# Patient Record
Sex: Female | Born: 1940 | Race: Black or African American | Hispanic: No | Marital: Single | Smoking: Never smoker
Health system: Southern US, Community
[De-identification: ages and names within clinical notes are randomized; demographics above are authoritative.]

## PROBLEM LIST (undated history)

## (undated) DIAGNOSIS — K219 Gastro-esophageal reflux disease without esophagitis: Secondary | ICD-10-CM

## (undated) DIAGNOSIS — K449 Diaphragmatic hernia without obstruction or gangrene: Secondary | ICD-10-CM

## (undated) DIAGNOSIS — M109 Gout, unspecified: Secondary | ICD-10-CM

## (undated) DIAGNOSIS — I10 Essential (primary) hypertension: Secondary | ICD-10-CM

## (undated) DIAGNOSIS — I219 Acute myocardial infarction, unspecified: Secondary | ICD-10-CM

## (undated) DIAGNOSIS — I2510 Atherosclerotic heart disease of native coronary artery without angina pectoris: Secondary | ICD-10-CM

## (undated) HISTORY — PX: HIP SURGERY: SHX245

## (undated) HISTORY — PX: ABDOMINAL HYSTERECTOMY: SHX81

## (undated) HISTORY — PX: CORONARY ANGIOPLASTY WITH STENT PLACEMENT: SHX49

---

## 2016-11-11 ENCOUNTER — Encounter (HOSPITAL_BASED_OUTPATIENT_CLINIC_OR_DEPARTMENT_OTHER): Payer: Self-pay

## 2016-11-11 ENCOUNTER — Observation Stay (HOSPITAL_BASED_OUTPATIENT_CLINIC_OR_DEPARTMENT_OTHER)
Admission: EM | Admit: 2016-11-11 | Discharge: 2016-11-13 | Disposition: A | Discharge status: Home / Self Care | Payer: Medicare Other | Attending: Family Medicine | Admitting: Family Medicine

## 2016-11-11 ENCOUNTER — Emergency Department (HOSPITAL_BASED_OUTPATIENT_CLINIC_OR_DEPARTMENT_OTHER): Payer: Medicare Other

## 2016-11-11 DIAGNOSIS — I10 Essential (primary) hypertension: Secondary | ICD-10-CM | POA: Diagnosis not present

## 2016-11-11 DIAGNOSIS — M109 Gout, unspecified: Secondary | ICD-10-CM | POA: Diagnosis not present

## 2016-11-11 DIAGNOSIS — K8012 Calculus of gallbladder with acute and chronic cholecystitis without obstruction: Principal | ICD-10-CM | POA: Insufficient documentation

## 2016-11-11 DIAGNOSIS — Z888 Allergy status to other drugs, medicaments and biological substances status: Secondary | ICD-10-CM | POA: Insufficient documentation

## 2016-11-11 DIAGNOSIS — I252 Old myocardial infarction: Secondary | ICD-10-CM | POA: Diagnosis not present

## 2016-11-11 DIAGNOSIS — K819 Cholecystitis, unspecified: Secondary | ICD-10-CM

## 2016-11-11 DIAGNOSIS — Z955 Presence of coronary angioplasty implant and graft: Secondary | ICD-10-CM | POA: Insufficient documentation

## 2016-11-11 DIAGNOSIS — Z79899 Other long term (current) drug therapy: Secondary | ICD-10-CM | POA: Diagnosis not present

## 2016-11-11 DIAGNOSIS — E785 Hyperlipidemia, unspecified: Secondary | ICD-10-CM | POA: Insufficient documentation

## 2016-11-11 DIAGNOSIS — I2510 Atherosclerotic heart disease of native coronary artery without angina pectoris: Secondary | ICD-10-CM | POA: Diagnosis not present

## 2016-11-11 DIAGNOSIS — K219 Gastro-esophageal reflux disease without esophagitis: Secondary | ICD-10-CM | POA: Diagnosis not present

## 2016-11-11 DIAGNOSIS — Z7982 Long term (current) use of aspirin: Secondary | ICD-10-CM | POA: Insufficient documentation

## 2016-11-11 DIAGNOSIS — R109 Unspecified abdominal pain: Secondary | ICD-10-CM | POA: Diagnosis present

## 2016-11-11 DIAGNOSIS — K810 Acute cholecystitis: Secondary | ICD-10-CM

## 2016-11-11 HISTORY — DX: Diaphragmatic hernia without obstruction or gangrene: K44.9

## 2016-11-11 HISTORY — DX: Atherosclerotic heart disease of native coronary artery without angina pectoris: I25.10

## 2016-11-11 HISTORY — DX: Gout, unspecified: M10.9

## 2016-11-11 HISTORY — DX: Acute myocardial infarction, unspecified: I21.9

## 2016-11-11 HISTORY — DX: Essential (primary) hypertension: I10

## 2016-11-11 HISTORY — DX: Gastro-esophageal reflux disease without esophagitis: K21.9

## 2016-11-11 LAB — COMPREHENSIVE METABOLIC PANEL
ALK PHOS: 155 U/L — AB (ref 38–126)
ALT: 57 U/L — AB (ref 14–54)
AST: 33 U/L (ref 15–41)
Albumin: 3.9 g/dL (ref 3.5–5.0)
Anion gap: 11 (ref 5–15)
BILIRUBIN TOTAL: 0.5 mg/dL (ref 0.3–1.2)
BUN: 17 mg/dL (ref 6–20)
CO2: 26 mmol/L (ref 22–32)
CREATININE: 1.05 mg/dL — AB (ref 0.44–1.00)
Calcium: 9.5 mg/dL (ref 8.9–10.3)
Chloride: 102 mmol/L (ref 101–111)
GFR calc Af Amer: 58 mL/min — ABNORMAL LOW (ref >60)
GFR calc non Af Amer: 50 mL/min — ABNORMAL LOW (ref >60)
GLUCOSE: 103 mg/dL — AB (ref 65–99)
POTASSIUM: 3.6 mmol/L (ref 3.5–5.1)
Sodium: 139 mmol/L (ref 135–145)
TOTAL PROTEIN: 7.9 g/dL (ref 6.5–8.1)

## 2016-11-11 LAB — URINALYSIS, ROUTINE W REFLEX MICROSCOPIC
Bilirubin Urine: NEGATIVE
GLUCOSE, UA: NEGATIVE mg/dL
HGB URINE DIPSTICK: NEGATIVE
KETONES UR: NEGATIVE mg/dL
LEUKOCYTES UA: NEGATIVE
Nitrite: NEGATIVE
PH: 6.5 (ref 5.0–8.0)
Protein, ur: NEGATIVE mg/dL
Specific Gravity, Urine: 1.005 (ref 1.005–1.030)

## 2016-11-11 LAB — CBC WITH DIFFERENTIAL/PLATELET
BASOS ABS: 0 10*3/uL (ref 0.0–0.1)
Basophils Relative: 0 %
EOS PCT: 6 %
Eosinophils Absolute: 0.5 10*3/uL (ref 0.0–0.7)
HEMATOCRIT: 36.2 % (ref 36.0–46.0)
Hemoglobin: 13.1 g/dL (ref 12.0–15.0)
LYMPHS ABS: 3.6 10*3/uL (ref 0.7–4.0)
LYMPHS PCT: 43 %
MCH: 25.4 pg — AB (ref 26.0–34.0)
MCHC: 36.2 g/dL — AB (ref 30.0–36.0)
MCV: 70.2 fL — AB (ref 78.0–100.0)
MONO ABS: 1.1 10*3/uL — AB (ref 0.1–1.0)
MONOS PCT: 13 %
Neutro Abs: 3.2 10*3/uL (ref 1.7–7.7)
Neutrophils Relative %: 38 %
PLATELETS: 239 10*3/uL (ref 150–400)
RBC: 5.16 MIL/uL — ABNORMAL HIGH (ref 3.87–5.11)
RDW: 16.9 % — AB (ref 11.5–15.5)
WBC: 8.4 10*3/uL (ref 4.0–10.5)

## 2016-11-11 LAB — LIPASE, BLOOD: Lipase: 52 U/L — ABNORMAL HIGH (ref 11–51)

## 2016-11-11 MED ORDER — IOPAMIDOL (ISOVUE-300) INJECTION 61%
100.0000 mL | Freq: Once | INTRAVENOUS | Status: AC | PRN
  Administered 2016-11-11: 100 mL via INTRAVENOUS

## 2016-11-11 MED ORDER — FENTANYL CITRATE (PF) 100 MCG/2ML IJ SOLN
50.0000 ug | Freq: Once | INTRAMUSCULAR | Status: AC
  Administered 2016-11-11: 50 ug via INTRAVENOUS

## 2016-11-11 MED ORDER — SODIUM CHLORIDE 0.9 % IV BOLUS (SEPSIS)
1000.0000 mL | Freq: Once | INTRAVENOUS | Status: AC
  Administered 2016-11-11: 1000 mL via INTRAVENOUS

## 2016-11-11 MED ORDER — ONDANSETRON HCL 4 MG/2ML IJ SOLN
INTRAMUSCULAR | Status: AC
  Administered 2016-11-11: 4 mg
  Filled 2016-11-11: qty 2

## 2016-11-11 MED ORDER — FENTANYL CITRATE (PF) 100 MCG/2ML IJ SOLN
INTRAMUSCULAR | Status: AC
  Filled 2016-11-11: qty 2

## 2016-11-11 NOTE — ED Provider Notes (Signed)
[facility] Provider Note   CSN: [id number] Arrival date & time: 11/11/16  [id number]  By signing my name below, I, Rosana Fretana Waskiewicz, attest that this documentation has been prepared under the direction and in the presence of Rayshon Albaugh, Cindee Saltourteney Lyn, MD. Electronically Signed: Rosana Fretana Waskiewicz, ED Scribe. 11/11/16. 7:55 PM.  History   Chief Complaint Chief Complaint  Patient presents with  . Abdominal Pain   No language interpreter was used.   HPI Comments: Robin Ali is a 76 y.o. female with a PMHx of GERD and a hiatal hernia, who presents to the Emergency Department complaining of reoccurring, moderate abdominal pain onset 3 days ago. Pt states the pain radiates to her back. Pt reports associated constipation. Pt has tried miralax and stool softeners and once once she had a BM, she felt better. She states today the pain returned. Per pt, she had similar symptoms 3 years ago and it was constipation. Pt denies nausea, vomiting, fever, dysuria or any other complaints at this time.  Past Medical History:  Diagnosis Date  . GERD (gastroesophageal reflux disease)   . Gout   . Heart attack (HCC)   . Hiatal hernia   . Hypertension     There are no active problems to display for this patient.   Past Surgical History:  Procedure Laterality Date  . ABDOMINAL HYSTERECTOMY    . CORONARY ANGIOPLASTY WITH STENT PLACEMENT    . HIP SURGERY      OB History    No data available       Home Medications    Prior to Admission medications   Not on File    Family History No family history on file.  Social History Social History  Substance Use Topics  . Smoking status: Never Smoker  . Smokeless tobacco: Never Used  . Alcohol use Yes     Comment: occ     Allergies   Ace inhibitors and Norvasc [amlodipine besylate]   Review of Systems Review of Systems  Constitutional: Negative for fever.  Gastrointestinal: Positive for abdominal pain and constipation. Negative for  nausea and vomiting.  Genitourinary: Negative for dysuria.  Musculoskeletal: Positive for back pain.  All other systems reviewed and are negative.    Physical Exam Updated Vital Signs BP (!) 165/76 (BP Location: Left Arm) Comment: Large cuff Per PT  Pulse 65   Temp 99.1 F (37.3 C) (Oral)   Resp 18   Ht 5\' 1"  (1.549 m)   Wt 76.2 kg (168 lb)   SpO2 100%   BMI 31.74 kg/m   Physical Exam  Constitutional: She is oriented to person, place, and time. She appears well-developed and well-nourished.  HENT:  Head: Normocephalic.  Eyes: EOM are normal.  Neck: Normal range of motion.  Cardiovascular: Normal rate, regular rhythm, normal heart sounds and intact distal pulses.  Exam reveals no gallop and no friction rub.   No murmur heard. Pulmonary/Chest: Effort normal and breath sounds normal. No respiratory distress. She has no wheezes. She has no rales.  Abdominal: Soft. She exhibits no distension. There is no tenderness.  mild tenderness to deep palpation.   Musculoskeletal: Normal range of motion.  Neurological: She is alert and oriented to person, place, and time.  Psychiatric: She has a normal mood and affect.  Nursing note and vitals reviewed.    ED Treatments / Results  DIAGNOSTIC STUDIES: Oxygen Saturation is 100% on RA, normal by my interpretation.   COORDINATION OF CARE: 7:53 PM-Discussed next steps with  pt including a CT. Pt verbalized understanding and is agreeable with the plan.   Labs (all labs ordered are listed, but only abnormal results are displayed) Labs Reviewed  CBC WITH DIFFERENTIAL/PLATELET - Abnormal; Notable for the following:       Result Value   RBC 5.16 (*)    MCV 70.2 (*)    MCH 25.4 (*)    MCHC 36.2 (*)    RDW 16.9 (*)    Monocytes Absolute 1.1 (*)    All other components within normal limits  COMPREHENSIVE METABOLIC PANEL - Abnormal; Notable for the following:    Glucose, Bld 103 (*)    Creatinine, Ser 1.05 (*)    ALT 57 (*)     Alkaline Phosphatase 155 (*)    GFR calc non Af Amer 50 (*)    GFR calc Af Amer 58 (*)    All other components within normal limits  LIPASE, BLOOD - Abnormal; Notable for the following:    Lipase 52 (*)    All other components within normal limits  URINALYSIS, ROUTINE W REFLEX MICROSCOPIC    EKG  EKG Interpretation None       Radiology Ct Abdomen Pelvis W Contrast  Result Date: 11/11/2016 CLINICAL DATA:  76 year old female with abdominal pain radiating to the back. EXAM: CT ABDOMEN AND PELVIS WITH CONTRAST TECHNIQUE: Multidetector CT imaging of the abdomen and pelvis was performed using the standard protocol following bolus administration of intravenous contrast. CONTRAST:  100mL ISOVUE-300 IOPAMIDOL (ISOVUE-300) INJECTION 61% COMPARISON:  Abdominal CT dated 09/23/2014 FINDINGS: Lower chest: The visualized lung bases are clear. Multi vessel coronary vascular calcification. No intra-abdominal free air or free fluid. Hepatobiliary: The liver is unremarkable. No intrahepatic biliary ductal dilatation. The gallbladder is distended. There is thickened and edematous appearance of the wall of the gallbladder with enhancement of the gallbladder mucosa. No calcified stone identified. Ultrasound is recommended for better evaluation of the gallbladder. Pancreas: Unremarkable. No pancreatic ductal dilatation or surrounding inflammatory changes. Spleen: Normal in size without focal abnormality. Adrenals/Urinary Tract: A 9 mm indeterminate left adrenal nodule as seen on the prior CT, possibly an adenoma. The right adrenal gland is unremarkable. The kidneys, visualized ureters, and urinary bladder are grossly unremarkable. Stomach/Bowel: There is a small hiatal hernia with gastroesophageal reflux. The stomach is distended. There is no evidence of gastric outlet obstruction. Oral contrast opacifies multiple loops of small bowel. No evidence of bowel obstruction or active inflammation. There is moderate stool  throughout the colon. Normal appendix. Vascular/Lymphatic: There is moderate aortoiliac atherosclerotic disease. The IVC appears unremarkable. No portal venous gas identified. There is no adenopathy. Stable top-normal lymph node in the gastrohepatic ligament. Reproductive: Hysterectomy. Evaluation of the pelvis is limited due to streak artifact caused by bilateral hip arthroplasties. Other: Fat containing umbilical hernia. No inflammatory changes. No fluid collection. Musculoskeletal: Degenerative changes of the lower lumbar spine with facet hypertrophy. L5-S1 disc desiccation and vacuum phenomena. Bilateral hip arthroplasty changes. No acute fracture. IMPRESSION: 1. Distended and thickened gallbladder concerning for acute cholecystitis. Further evaluation with ultrasound recommended. 2. No bowel obstruction or active inflammation.  Normal appendix. 3.  Aortic Atherosclerosis (ICD10-I70.0). 4. Coronary vascular calcification. Electronically Signed   By: Elgie CollardArash  Radparvar M.D.   On: 11/11/2016 22:19    Procedures Procedures (including critical care time)  Medications Ordered in ED Medications  fentaNYL (SUBLIMAZE) 100 MCG/2ML injection (  Not Given 11/11/16 2055)  ondansetron (ZOFRAN) 4 MG/2ML injection (4 mg  Given 11/11/16 1927)  fentaNYL (  SUBLIMAZE) injection 50 mcg (50 mcg Intravenous Given 11/11/16 1945)  sodium chloride 0.9 % bolus 1,000 mL (0 mLs Intravenous Stopped 11/11/16 2307)  iopamidol (ISOVUE-300) 61 % injection 100 mL (100 mLs Intravenous Contrast Given 11/11/16 2156)     Initial Impression / Assessment and Plan / ED Course  I have reviewed the triage vital signs and the nursing notes.  Pertinent labs & imaging results that were available during my care of the patient were reviewed by me and considered in my medical decision making (see chart for details).     I personally performed the services described in this documentation, which was scribed in my presence. The recorded information has  been reviewed and is accurate.   Patient 76 year old female presenting with abdominal pain. Fairly nonspecific. Initially was epigastric but then patient Patient had treated use some MiraLAX and she felt much improved after stooling. This is earlier this week. Then the pain came back today. Patient's had mild nausea.  CT shows question will cholecystitis. Vague story, but mild abnormalities in lipase and ALT. Temp 99.1. Discussed with Dr. Dwain Sarna who believes it does not convincing without an [place]. Unfortunately [facility] has no [place] right now.   WIll transfer to [facility] for [place] in the ED.    Final Clinical Impressions(s) / ED Diagnoses   Final diagnoses:  Cholecystitis    New Prescriptions New Prescriptions   No medications on file     Abelino Derrick, MD 11/11/16 2312

## 2016-11-11 NOTE — ED Triage Notes (Signed)
C/o abd pain,mid back pain 5 days ago-better after miralax and BM-started back again today-last BM today-presents to triage in w/c-grimacing

## 2016-11-11 NOTE — ED Notes (Signed)
Pt and pt's visitor expresses irritation that EMT is not using the "big" cuff to do the pt's blood pressure. EMT explained to pt that the correct size needs to be used to get an accurate reading, if you use the wrong size you get a wrong reading. Pt and pt's visitor not happy with this answer. EMT told pt she would send in the RN to explain to the pt the same reasoning behind the blood pressure cuff.

## 2016-11-11 NOTE — ED Notes (Signed)
RN Janna Archata informed that Pts want their medications entered in her chart.

## 2016-11-12 ENCOUNTER — Inpatient Hospital Stay (HOSPITAL_COMMUNITY): Payer: Medicare Other | Admitting: Anesthesiology

## 2016-11-12 ENCOUNTER — Encounter (HOSPITAL_COMMUNITY): Payer: Self-pay | Admitting: Internal Medicine

## 2016-11-12 ENCOUNTER — Emergency Department (HOSPITAL_COMMUNITY): Payer: Medicare Other

## 2016-11-12 ENCOUNTER — Encounter (HOSPITAL_COMMUNITY)
Admission: EM | Disposition: A | Discharge status: Home / Self Care | Payer: Self-pay | Source: Home / Self Care | Attending: Physician Assistant

## 2016-11-12 DIAGNOSIS — K219 Gastro-esophageal reflux disease without esophagitis: Secondary | ICD-10-CM | POA: Diagnosis present

## 2016-11-12 DIAGNOSIS — K810 Acute cholecystitis: Secondary | ICD-10-CM | POA: Diagnosis present

## 2016-11-12 DIAGNOSIS — E785 Hyperlipidemia, unspecified: Secondary | ICD-10-CM | POA: Diagnosis present

## 2016-11-12 DIAGNOSIS — I2510 Atherosclerotic heart disease of native coronary artery without angina pectoris: Secondary | ICD-10-CM | POA: Diagnosis present

## 2016-11-12 DIAGNOSIS — M109 Gout, unspecified: Secondary | ICD-10-CM | POA: Diagnosis present

## 2016-11-12 DIAGNOSIS — K8012 Calculus of gallbladder with acute and chronic cholecystitis without obstruction: Secondary | ICD-10-CM | POA: Diagnosis not present

## 2016-11-12 DIAGNOSIS — I10 Essential (primary) hypertension: Secondary | ICD-10-CM | POA: Diagnosis present

## 2016-11-12 HISTORY — PX: CHOLECYSTECTOMY: SHX55

## 2016-11-12 LAB — GLUCOSE, CAPILLARY: Glucose-Capillary: 130 mg/dL — ABNORMAL HIGH (ref 65–99)

## 2016-11-12 LAB — BASIC METABOLIC PANEL
Anion gap: 9 (ref 5–15)
BUN: 12 mg/dL (ref 6–20)
CALCIUM: 9 mg/dL (ref 8.9–10.3)
CO2: 27 mmol/L (ref 22–32)
CREATININE: 1 mg/dL (ref 0.44–1.00)
Chloride: 104 mmol/L (ref 101–111)
GFR calc non Af Amer: 53 mL/min — ABNORMAL LOW (ref >60)
GLUCOSE: 106 mg/dL — AB (ref 65–99)
Potassium: 3.1 mmol/L — ABNORMAL LOW (ref 3.5–5.1)
Sodium: 140 mmol/L (ref 135–145)

## 2016-11-12 LAB — PROTIME-INR
INR: 1.23
PROTHROMBIN TIME: 15.6 s — AB (ref 11.4–15.2)

## 2016-11-12 LAB — CBC
HCT: 36.4 % (ref 36.0–46.0)
Hemoglobin: 12.6 g/dL (ref 12.0–15.0)
MCH: 24.6 pg — AB (ref 26.0–34.0)
MCHC: 34.6 g/dL (ref 30.0–36.0)
MCV: 71 fL — ABNORMAL LOW (ref 78.0–100.0)
PLATELETS: 201 10*3/uL (ref 150–400)
RBC: 5.13 MIL/uL — AB (ref 3.87–5.11)
RDW: 16.3 % — ABNORMAL HIGH (ref 11.5–15.5)
WBC: 9.5 10*3/uL (ref 4.0–10.5)

## 2016-11-12 LAB — MAGNESIUM: MAGNESIUM: 1.7 mg/dL (ref 1.7–2.4)

## 2016-11-12 LAB — TYPE AND SCREEN
ABO/RH(D): O POS
ANTIBODY SCREEN: NEGATIVE

## 2016-11-12 LAB — APTT: aPTT: 36 seconds (ref 24–36)

## 2016-11-12 LAB — SURGICAL PCR SCREEN
MRSA, PCR: NEGATIVE
Staphylococcus aureus: NEGATIVE

## 2016-11-12 LAB — ABO/RH: ABO/RH(D): O POS

## 2016-11-12 SURGERY — LAPAROSCOPIC CHOLECYSTECTOMY
Anesthesia: General | Site: Abdomen

## 2016-11-12 MED ORDER — ONDANSETRON HCL 4 MG/2ML IJ SOLN
INTRAMUSCULAR | Status: DC | PRN
  Administered 2016-11-12: 4 mg via INTRAVENOUS

## 2016-11-12 MED ORDER — SCOPOLAMINE 1 MG/3DAYS TD PT72
MEDICATED_PATCH | TRANSDERMAL | Status: DC | PRN
  Administered 2016-11-12: 1 via TRANSDERMAL

## 2016-11-12 MED ORDER — PHENYLEPHRINE 40 MCG/ML (10ML) SYRINGE FOR IV PUSH (FOR BLOOD PRESSURE SUPPORT)
PREFILLED_SYRINGE | INTRAVENOUS | Status: AC
  Filled 2016-11-12: qty 10

## 2016-11-12 MED ORDER — POTASSIUM CHLORIDE CRYS ER 20 MEQ PO TBCR
40.0000 meq | EXTENDED_RELEASE_TABLET | Freq: Once | ORAL | Status: AC
  Administered 2016-11-12: 40 meq via ORAL
  Filled 2016-11-12: qty 2

## 2016-11-12 MED ORDER — PHENYLEPHRINE HCL 10 MG/ML IJ SOLN
INTRAMUSCULAR | Status: DC | PRN
  Administered 2016-11-12: 10 ug/min via INTRAVENOUS

## 2016-11-12 MED ORDER — FENTANYL CITRATE (PF) 250 MCG/5ML IJ SOLN
INTRAMUSCULAR | Status: AC
  Filled 2016-11-12: qty 5

## 2016-11-12 MED ORDER — SUGAMMADEX SODIUM 200 MG/2ML IV SOLN
INTRAVENOUS | Status: DC | PRN
  Administered 2016-11-12: 304 mg via INTRAVENOUS

## 2016-11-12 MED ORDER — FENTANYL CITRATE (PF) 100 MCG/2ML IJ SOLN
25.0000 ug | INTRAMUSCULAR | Status: DC | PRN
  Administered 2016-11-12 (×4): 25 ug via INTRAVENOUS

## 2016-11-12 MED ORDER — ASPIRIN EC 81 MG PO TBEC
81.0000 mg | DELAYED_RELEASE_TABLET | Freq: Every day | ORAL | Status: DC
  Administered 2016-11-12 – 2016-11-13 (×2): 81 mg via ORAL
  Filled 2016-11-12 (×2): qty 1

## 2016-11-12 MED ORDER — ONDANSETRON HCL 4 MG/2ML IJ SOLN
INTRAMUSCULAR | Status: AC
  Filled 2016-11-12: qty 4

## 2016-11-12 MED ORDER — SODIUM CHLORIDE 0.9 % IV SOLN
INTRAVENOUS | Status: DC
  Administered 2016-11-12: 03:00:00 via INTRAVENOUS

## 2016-11-12 MED ORDER — 0.9 % SODIUM CHLORIDE (POUR BTL) OPTIME
TOPICAL | Status: DC | PRN
  Administered 2016-11-12: 1000 mL

## 2016-11-12 MED ORDER — LACTATED RINGERS IV SOLN
INTRAVENOUS | Status: DC
  Administered 2016-11-12: 09:00:00 via INTRAVENOUS

## 2016-11-12 MED ORDER — PROMETHAZINE HCL 25 MG PO TABS
25.0000 mg | ORAL_TABLET | Freq: Four times a day (QID) | ORAL | Status: DC | PRN
  Administered 2016-11-12: 25 mg via ORAL
  Filled 2016-11-12: qty 1

## 2016-11-12 MED ORDER — PIPERACILLIN-TAZOBACTAM 3.375 G IVPB
3.3750 g | Freq: Three times a day (TID) | INTRAVENOUS | Status: DC
  Administered 2016-11-12: 3.375 g via INTRAVENOUS
  Filled 2016-11-12 (×2): qty 50

## 2016-11-12 MED ORDER — PROMETHAZINE HCL 25 MG RE SUPP: 25.0000 mg | Freq: Four times a day (QID) | RECTAL | Status: DC | PRN

## 2016-11-12 MED ORDER — HYDROMORPHONE HCL 1 MG/ML IJ SOLN: 1.0000 mg | INTRAMUSCULAR | Status: DC | PRN

## 2016-11-12 MED ORDER — HYDRALAZINE HCL 20 MG/ML IJ SOLN: 5.0000 mg | INTRAMUSCULAR | Status: DC | PRN

## 2016-11-12 MED ORDER — ORAL CARE MOUTH RINSE: 15.0000 mL | Freq: Two times a day (BID) | OROMUCOSAL | Status: DC

## 2016-11-12 MED ORDER — ROCURONIUM BROMIDE 10 MG/ML (PF) SYRINGE
PREFILLED_SYRINGE | INTRAVENOUS | Status: DC | PRN
  Administered 2016-11-12: 50 mg via INTRAVENOUS

## 2016-11-12 MED ORDER — ONDANSETRON HCL 4 MG/2ML IJ SOLN: 4.0000 mg | Freq: Three times a day (TID) | INTRAMUSCULAR | Status: DC | PRN

## 2016-11-12 MED ORDER — LIDOCAINE 2% (20 MG/ML) 5 ML SYRINGE
INTRAMUSCULAR | Status: AC
  Filled 2016-11-12: qty 5

## 2016-11-12 MED ORDER — ONDANSETRON HCL 4 MG/2ML IJ SOLN
4.0000 mg | Freq: Four times a day (QID) | INTRAMUSCULAR | Status: DC | PRN
  Administered 2016-11-12: 4 mg via INTRAVENOUS
  Filled 2016-11-12: qty 2

## 2016-11-12 MED ORDER — DEXTROSE-NACL 5-0.9 % IV SOLN: INTRAVENOUS | Status: DC

## 2016-11-12 MED ORDER — POTASSIUM CHLORIDE IN NACL 20-0.9 MEQ/L-% IV SOLN
INTRAVENOUS | Status: DC
Start: 2016-11-12 — End: 2016-11-13
  Administered 2016-11-12 – 2016-11-13 (×2): via INTRAVENOUS
  Filled 2016-11-12 (×2): qty 1000

## 2016-11-12 MED ORDER — COLCHICINE 0.6 MG PO TABS
0.6000 mg | ORAL_TABLET | Freq: Every day | ORAL | Status: DC
  Administered 2016-11-12 – 2016-11-13 (×2): 0.6 mg via ORAL
  Filled 2016-11-12 (×2): qty 1

## 2016-11-12 MED ORDER — NIFEDIPINE ER 60 MG PO TB24
60.0000 mg | ORAL_TABLET | Freq: Every day | ORAL | Status: DC
  Administered 2016-11-13: 60 mg via ORAL
  Filled 2016-11-12 (×2): qty 1

## 2016-11-12 MED ORDER — ROCURONIUM BROMIDE 10 MG/ML (PF) SYRINGE
PREFILLED_SYRINGE | INTRAVENOUS | Status: AC
  Filled 2016-11-12: qty 5

## 2016-11-12 MED ORDER — BUPIVACAINE HCL (PF) 0.25 % IJ SOLN
INTRAMUSCULAR | Status: AC
  Filled 2016-11-12: qty 30

## 2016-11-12 MED ORDER — ONDANSETRON 4 MG PO TBDP: 4.0000 mg | ORAL_TABLET | Freq: Four times a day (QID) | ORAL | Status: DC | PRN

## 2016-11-12 MED ORDER — OXYCODONE HCL 5 MG PO TABS
5.0000 mg | ORAL_TABLET | ORAL | Status: DC | PRN
  Administered 2016-11-12 (×2): 5 mg via ORAL
  Administered 2016-11-13: 10 mg via ORAL
  Filled 2016-11-12: qty 2
  Filled 2016-11-12 (×2): qty 1

## 2016-11-12 MED ORDER — NITROGLYCERIN 0.4 MG SL SUBL: 0.4000 mg | SUBLINGUAL_TABLET | SUBLINGUAL | Status: DC | PRN

## 2016-11-12 MED ORDER — PIPERACILLIN-TAZOBACTAM 3.375 G IVPB 30 MIN
3.3750 g | Freq: Once | INTRAVENOUS | Status: AC
  Administered 2016-11-12: 3.375 g via INTRAVENOUS
  Filled 2016-11-12: qty 50

## 2016-11-12 MED ORDER — DEXAMETHASONE SODIUM PHOSPHATE 10 MG/ML IJ SOLN
INTRAMUSCULAR | Status: DC | PRN
  Administered 2016-11-12: 10 mg via INTRAVENOUS

## 2016-11-12 MED ORDER — CARVEDILOL 25 MG PO TABS
25.0000 mg | ORAL_TABLET | Freq: Two times a day (BID) | ORAL | Status: DC
  Administered 2016-11-12 – 2016-11-13 (×3): 25 mg via ORAL
  Filled 2016-11-12 (×3): qty 1

## 2016-11-12 MED ORDER — EPHEDRINE 5 MG/ML INJ
INTRAVENOUS | Status: AC
  Filled 2016-11-12: qty 10

## 2016-11-12 MED ORDER — PROPOFOL 10 MG/ML IV BOLUS
INTRAVENOUS | Status: DC | PRN
  Administered 2016-11-12: 120 mg via INTRAVENOUS

## 2016-11-12 MED ORDER — CHLORHEXIDINE GLUCONATE 0.12 % MT SOLN
15.0000 mL | Freq: Two times a day (BID) | OROMUCOSAL | Status: DC
  Filled 2016-11-12 (×2): qty 15

## 2016-11-12 MED ORDER — FENTANYL CITRATE (PF) 100 MCG/2ML IJ SOLN
INTRAMUSCULAR | Status: DC | PRN
  Administered 2016-11-12 (×4): 50 ug via INTRAVENOUS

## 2016-11-12 MED ORDER — ARTIFICIAL TEARS OPHTHALMIC OINT
TOPICAL_OINTMENT | OPHTHALMIC | Status: AC
  Filled 2016-11-12: qty 3.5

## 2016-11-12 MED ORDER — SODIUM CHLORIDE 0.9 % IR SOLN
Status: DC | PRN
  Administered 2016-11-12: 1

## 2016-11-12 MED ORDER — MORPHINE SULFATE (PF) 4 MG/ML IV SOLN
2.0000 mg | INTRAVENOUS | Status: DC | PRN
  Filled 2016-11-12: qty 1

## 2016-11-12 MED ORDER — FENTANYL CITRATE (PF) 100 MCG/2ML IJ SOLN
INTRAMUSCULAR | Status: AC
  Filled 2016-11-12: qty 2

## 2016-11-12 MED ORDER — SUGAMMADEX SODIUM 200 MG/2ML IV SOLN
INTRAVENOUS | Status: AC
  Filled 2016-11-12: qty 2

## 2016-11-12 MED ORDER — SUCCINYLCHOLINE CHLORIDE 200 MG/10ML IV SOSY
PREFILLED_SYRINGE | INTRAVENOUS | Status: AC
  Filled 2016-11-12: qty 10

## 2016-11-12 MED ORDER — FENTANYL CITRATE (PF) 100 MCG/2ML IJ SOLN
50.0000 ug | Freq: Once | INTRAMUSCULAR | Status: AC
  Administered 2016-11-12: 50 ug via INTRAVENOUS
  Filled 2016-11-12: qty 2

## 2016-11-12 MED ORDER — PANTOPRAZOLE SODIUM 40 MG PO TBEC
40.0000 mg | DELAYED_RELEASE_TABLET | Freq: Every day | ORAL | Status: DC
  Administered 2016-11-12 – 2016-11-13 (×2): 40 mg via ORAL
  Filled 2016-11-12 (×2): qty 1

## 2016-11-12 MED ORDER — DEXAMETHASONE SODIUM PHOSPHATE 10 MG/ML IJ SOLN
INTRAMUSCULAR | Status: AC
  Filled 2016-11-12: qty 1

## 2016-11-12 MED ORDER — POLYETHYLENE GLYCOL 3350 17 G PO PACK: 17.0000 g | PACK | Freq: Every day | ORAL | Status: DC | PRN

## 2016-11-12 MED ORDER — BUPIVACAINE HCL 0.25 % IJ SOLN
INTRAMUSCULAR | Status: DC | PRN
  Administered 2016-11-12: 6 mL

## 2016-11-12 MED ORDER — PROPOFOL 10 MG/ML IV BOLUS
INTRAVENOUS | Status: AC
  Filled 2016-11-12: qty 20

## 2016-11-12 MED ORDER — LIDOCAINE 2% (20 MG/ML) 5 ML SYRINGE
INTRAMUSCULAR | Status: DC | PRN
  Administered 2016-11-12: 60 mg via INTRAVENOUS

## 2016-11-12 SURGICAL SUPPLY — 42 items
BENZOIN TINCTURE PRP APPL 2/3 (GAUZE/BANDAGES/DRESSINGS) ×3 IMPLANT
CANISTER SUCT 3000ML PPV (MISCELLANEOUS) ×3 IMPLANT
CHLORAPREP W/TINT 26ML (MISCELLANEOUS) ×3 IMPLANT
CLIP VESOLOCK MED LG 6/CT (CLIP) ×3 IMPLANT
CLOSURE WOUND 1/2 X4 (GAUZE/BANDAGES/DRESSINGS) ×1
COVER SURGICAL LIGHT HANDLE (MISCELLANEOUS) ×3 IMPLANT
COVER TRANSDUCER ULTRASND (DRAPES) ×3 IMPLANT
ELECT REM PT RETURN 9FT ADLT (ELECTROSURGICAL) ×3
ELECTRODE REM PT RTRN 9FT ADLT (ELECTROSURGICAL) ×1 IMPLANT
GAUZE SPONGE 2X2 8PLY STRL LF (GAUZE/BANDAGES/DRESSINGS) ×1 IMPLANT
GLOVE BIO SURGEON STRL SZ7.5 (GLOVE) ×3 IMPLANT
GLOVE BIOGEL PI IND STRL 7.0 (GLOVE) ×1 IMPLANT
GLOVE BIOGEL PI IND STRL 8.5 (GLOVE) ×1 IMPLANT
GLOVE BIOGEL PI INDICATOR 7.0 (GLOVE) ×2
GLOVE BIOGEL PI INDICATOR 8.5 (GLOVE) ×2
GLOVE SURG SS PI 7.0 STRL IVOR (GLOVE) ×3 IMPLANT
GLOVE SURG SS PI 8.0 STRL IVOR (GLOVE) ×3 IMPLANT
GOWN STRL REUS W/ TWL LRG LVL3 (GOWN DISPOSABLE) ×2 IMPLANT
GOWN STRL REUS W/ TWL XL LVL3 (GOWN DISPOSABLE) ×1 IMPLANT
GOWN STRL REUS W/TWL LRG LVL3 (GOWN DISPOSABLE) ×4
GOWN STRL REUS W/TWL XL LVL3 (GOWN DISPOSABLE) ×2
GRASPER SUT TROCAR 14GX15 (MISCELLANEOUS) ×3 IMPLANT
KIT BASIN OR (CUSTOM PROCEDURE TRAY) ×3 IMPLANT
KIT ROOM TURNOVER OR (KITS) ×3 IMPLANT
NEEDLE INSUFFLATION 14GA 120MM (NEEDLE) ×3 IMPLANT
NS IRRIG 1000ML POUR BTL (IV SOLUTION) ×3 IMPLANT
PAD ARMBOARD 7.5X6 YLW CONV (MISCELLANEOUS) ×6 IMPLANT
POUCH RETRIEVAL ECOSAC 10 (ENDOMECHANICALS) IMPLANT
POUCH RETRIEVAL ECOSAC 10MM (ENDOMECHANICALS)
SCISSORS LAP 5X35 DISP (ENDOMECHANICALS) ×3 IMPLANT
SET IRRIG TUBING LAPAROSCOPIC (IRRIGATION / IRRIGATOR) ×3 IMPLANT
SLEEVE ENDOPATH XCEL 5M (ENDOMECHANICALS) ×3 IMPLANT
SPECIMEN JAR SMALL (MISCELLANEOUS) ×3 IMPLANT
SPONGE GAUZE 2X2 STER 10/PKG (GAUZE/BANDAGES/DRESSINGS) ×2
STRIP CLOSURE SKIN 1/2X4 (GAUZE/BANDAGES/DRESSINGS) ×2 IMPLANT
SUT MNCRL AB 3-0 PS2 18 (SUTURE) ×3 IMPLANT
TOWEL OR 17X24 6PK STRL BLUE (TOWEL DISPOSABLE) ×3 IMPLANT
TOWEL OR 17X26 10 PK STRL BLUE (TOWEL DISPOSABLE) ×3 IMPLANT
TRAY LAPAROSCOPIC MC (CUSTOM PROCEDURE TRAY) ×3 IMPLANT
TROCAR XCEL NON-BLD 11X100MML (ENDOMECHANICALS) ×3 IMPLANT
TROCAR XCEL NON-BLD 5MMX100MML (ENDOMECHANICALS) ×3 IMPLANT
TUBING INSUFFLATION (TUBING) ×3 IMPLANT

## 2016-11-12 NOTE — Progress Notes (Signed)
Pharmacy Antibiotic Note  Robin Ali is a 76 y.o. female admitted on 11/11/2016 with cholecystitis.  Pharmacy has been consulted for Zosyn dosing.  Plan: Zosyn 3.375g IV q8h (4 hour infusion).  Height: 5\' 1"  (154.9 cm) Weight: 168 lb (76.2 kg) IBW/kg (Calculated) : 47.8  Temp (24hrs), Avg:98.6 F (37 C), Min:98.1 F (36.7 C), Max:99.1 F (37.3 C)   Recent Labs Lab 11/11/16 1928  WBC 8.4  CREATININE 1.05*    Estimated Creatinine Clearance: 41.9 mL/min (A) (by C-G formula based on SCr of 1.05 mg/dL (H)).    Allergies  Allergen Reactions  . Ace Inhibitors   . Norvasc [Amlodipine Besylate]     Thank you for allowing pharmacy to be a part of this patient's care.  Vernard GamblesVeronda Fisher Hargadon, PharmD, BCPS  11/12/2016 2:50 AM

## 2016-11-12 NOTE — Progress Notes (Signed)
Patient returned to 6n05 from PACU, alert slightly drowsy, feeling okay just tender. IV fluids running, on 2L o2, 3 abdominal lap sites with gauze all clean/dry/intact. Patient's daughter in room, notified them if they needed anything to call, otherwise we'll let her rest.

## 2016-11-12 NOTE — Transfer of Care (Signed)
Immediate Anesthesia Transfer of Care Note  Patient: Robin Ali  Procedure(s) Performed: Procedure(s): LAPAROSCOPIC CHOLECYSTECTOMY (N/A)  Patient Location: PACU  Anesthesia Type:General  Level of Consciousness: awake, alert  and oriented  Airway & Oxygen Therapy: Patient Spontanous Breathing and Patient connected to nasal cannula oxygen  Post-op Assessment: Report given to RN and Post -op Vital signs reviewed and stable  Post vital signs: Reviewed and stable  Last Vitals:  Vitals:   11/12/16 0400 11/12/16 0431  BP: (!) 152/64 (!) 165/65  Pulse: (!) 57 63  Resp:  18  Temp:  36.7 C   HR 64, RR 17, sats 99%. bP 146/67 Last Pain:  Vitals:   11/12/16 0431  TempSrc: Oral  PainSc:          Complications: No apparent anesthesia complications

## 2016-11-12 NOTE — H&P (Signed)
History and Physical    Robin Ali ZOX:[id number] DOB: 1941-02-09 DOA: 11/11/2016  Referring MD/NP/PA:   PCP: Leola Brazil, DO   Patient coming from:  The patient is coming from home.  At baseline, pt is independent for most of ADL. SNF  Assistant living facility   Retirement center.       Chief Complaint: Abdominal pain  HPI: Robin Ali is a 76 y.o. female with medical history significant of hypertension, GERD, gout, CAD, s/p of stent placement, hiatal hernia, who presents with abdominal pain.  Patient states that she has been having abdominal pain in the past 5 days. It is located in the upper abdomen, constant, aching, tingling out of 10 in severity, radiating to the back. It is not associated with nausea, vomiting or diarrhea. No fever or chills. Last bowel movement was yesterday morning. Patient denies chest pain, SOB, cough, symptoms of UTI or unilateral weakness.  ED Course: pt was found to have lipase 52, WBC 8.4, negative urinalysis, abnormal liver functions with AST 33, ALT 57, ALP 152, total bilirubin 0.5, electrolytes renal function okay, temperature 99.1, tachycardia, O2 sat 100% on room air. CT abdomen/pelvis and abdominal ultrasound showed cholecystitis. Patient is admitted to the MedSurg bed as inpatient. Gen. surgeon, Dr. Dwain Sarna was consulted.   Review of Systems:   General: no fevers, chills, no changes in body weight, has poor appetite, has fatigue HEENT: no blurry vision, hearing changes or sore throat Respiratory: no dyspnea, coughing, wheezing CV: no chest pain, no palpitations GI: no nausea, vomiting, has abdominal pain, no diarrhea, constipation GU: no dysuria, burning on urination, increased urinary frequency, hematuria  Ext: no leg edema Neuro: no unilateral weakness, numbness, or tingling, no vision change or hearing loss Skin: no rash, no skin tear. MSK: No muscle spasm, no deformity, no limitation of range of movement in spin Heme: No easy  bruising.  Travel history: No recent long distant travel.  Allergy:  Allergies  Allergen Reactions  . Ace Inhibitors Cough  . Norvasc [Amlodipine Besylate] Swelling    Past Medical History:  Diagnosis Date  . CAD (coronary artery disease)   . GERD (gastroesophageal reflux disease)   . Gout   . Heart attack (HCC)   . Hiatal hernia   . Hypertension     Past Surgical History:  Procedure Laterality Date  . ABDOMINAL HYSTERECTOMY    . CORONARY ANGIOPLASTY WITH STENT PLACEMENT    . HIP SURGERY      Social History:  reports that she has never smoked. She has never used smokeless tobacco. She reports that she drinks alcohol. She reports that she does not use drugs.  Family History:  Family History  Problem Relation Age of Onset  . Congestive Heart Failure Mother   . Pancreatic cancer Brother      Prior to Admission medications   Medication Sig Start Date End Date Taking? Authorizing Provider  aspirin EC 81 MG tablet Take 81 mg by mouth daily.   Yes [provider]  carvedilol (COREG) 25 MG tablet Take 25 mg by mouth 2 (two) times daily with a meal.   Yes [provider]  colchicine 0.6 MG tablet Take 0.6 mg by mouth daily.   Yes [provider]  NIFEdipine (PROCARDIA XL/ADALAT-CC) 60 MG 24 hr tablet Take 60 mg by mouth daily.   Yes [provider]  nitrofurantoin (MACRODANTIN) 100 MG capsule Take 100 mg by mouth 2 (two) times daily.   Yes [provider]  pantoprazole (PROTONIX) 40 MG tablet Take 40 mg by mouth daily.   Yes [provider]  triamterene-hydrochlorothiazide (MAXZIDE-25) 37.5-25 MG tablet Take 1 tablet by mouth daily.   Yes [provider]    Physical Exam: Vitals:   11/12/16 0300 11/12/16 0330 11/12/16 0400 11/12/16 0431  BP: (!) 185/73 (!) 165/63 (!) 152/64 (!) 165/65  Pulse: 61 (!) 57 (!) 57 63  Resp:    18  Temp:    98.1 F (36.7 C)  TempSrc:    Oral  SpO2: 97% 98% 96% 100%  Weight:    76  kg (167 lb 8.8 oz)  Height:    5\' 1"  (1.549 m)   General: Not in acute distress HEENT:       Eyes: PERRL, EOMI, no scleral icterus.       ENT: No discharge from the ears and nose, no pharynx injection, no tonsillar enlargement.        Neck: No JVD, no bruit, no mass felt. Heme: No neck lymph node enlargement. Cardiac: S1/S2, RRR, No murmurs, No gallops or rubs. Respiratory: Good air movement bilaterally. No rales, wheezing, rhonchi or rubs. GI: Soft, nondistended, has tenderness in epigastric area. no rebound pain, no organomegaly, BS present. GU: No hematuria Ext: No pitting leg edema bilaterally. 2+DP/PT pulse bilaterally. Musculoskeletal: No joint deformities, No joint redness or warmth, no limitation of ROM in spin. Skin: No rashes.  Neuro: Alert, oriented X3, cranial nerves II-XII grossly intact, moves all extremities normally. Psych: Patient is not psychotic, no suicidal or hemocidal ideation.  Labs on Admission: I have personally reviewed following labs and imaging studies  CBC:  Recent Labs Lab 11/11/16 1928 11/12/16 0448  WBC 8.4 9.5  NEUTROABS 3.2  --   HGB 13.1 12.6  HCT 36.2 36.4  MCV 70.2* 71.0*  PLT 239 201   Basic Metabolic Panel:  Recent Labs Lab 11/11/16 1928 11/12/16 0448  NA 139 140  K 3.6 3.1*  CL 102 104  CO2 26 27  GLUCOSE 103* 106*  BUN 17 12  CREATININE 1.05* 1.00  CALCIUM 9.5 9.0   GFR: Estimated Creatinine Clearance: 44 mL/min (by C-G formula based on SCr of 1 mg/dL). Liver Function Tests:  Recent Labs Lab 11/11/16 1928  AST 33  ALT 57*  ALKPHOS 155*  BILITOT 0.5  PROT 7.9  ALBUMIN 3.9    Recent Labs Lab 11/11/16 1928  LIPASE 52*   No results for input(s): AMMONIA in the last 168 hours. Coagulation Profile:  Recent Labs Lab 11/12/16 0447  INR 1.23   Cardiac Enzymes: No results for input(s): CKTOTAL, CKMB, CKMBINDEX, TROPONINI in the last 168 hours. BNP (last 3 results) No results for input(s): PROBNP in the last  8760 hours. HbA1C: No results for input(s): HGBA1C in the last 72 hours. CBG: No results for input(s): GLUCAP in the last 168 hours. Lipid Profile: No results for input(s): CHOL, HDL, LDLCALC, TRIG, CHOLHDL, LDLDIRECT in the last 72 hours. Thyroid Function Tests: No results for input(s): TSH, T4TOTAL, FREET4, T3FREE, THYROIDAB in the last 72 hours. Anemia Panel: No results for input(s): VITAMINB12, FOLATE, FERRITIN, TIBC, IRON, RETICCTPCT in the last 72 hours. Urine analysis:    Component Value Date/Time   COLORURINE YELLOW 11/11/2016 1929   APPEARANCEUR CLEAR 11/11/2016 1929   LABSPEC 1.005 11/11/2016 1929   PHURINE 6.5 11/11/2016 1929   GLUCOSEU NEGATIVE 11/11/2016 1929   HGBUR NEGATIVE 11/11/2016 1929   BILIRUBINUR NEGATIVE 11/11/2016 1929   KETONESUR NEGATIVE 11/11/2016 1929  PROTEINUR NEGATIVE 11/11/2016 1929   NITRITE NEGATIVE 11/11/2016 1929   LEUKOCYTESUR NEGATIVE 11/11/2016 1929   Sepsis Labs: @LABRCNTIP (procalcitonin:4,lacticidven:4) )No results found for this or any previous visit (from the past 240 hour(s)).   Radiological Exams on Admission: Ct Abdomen Pelvis W Contrast  Result Date: 11/11/2016 CLINICAL DATA:  76 year old female with abdominal pain radiating to the back. EXAM: CT ABDOMEN AND PELVIS WITH CONTRAST TECHNIQUE: Multidetector CT imaging of the abdomen and pelvis was performed using the standard protocol following bolus administration of intravenous contrast. CONTRAST:  100mL ISOVUE-300 IOPAMIDOL (ISOVUE-300) INJECTION 61% COMPARISON:  Abdominal CT dated 09/23/2014 FINDINGS: Lower chest: The visualized lung bases are clear. Multi vessel coronary vascular calcification. No intra-abdominal free air or free fluid. Hepatobiliary: The liver is unremarkable. No intrahepatic biliary ductal dilatation. The gallbladder is distended. There is thickened and edematous appearance of the wall of the gallbladder with enhancement of the gallbladder mucosa. No calcified stone  identified. Ultrasound is recommended for better evaluation of the gallbladder. Pancreas: Unremarkable. No pancreatic ductal dilatation or surrounding inflammatory changes. Spleen: Normal in size without focal abnormality. Adrenals/Urinary Tract: A 9 mm indeterminate left adrenal nodule as seen on the prior CT, possibly an adenoma. The right adrenal gland is unremarkable. The kidneys, visualized ureters, and urinary bladder are grossly unremarkable. Stomach/Bowel: There is a small hiatal hernia with gastroesophageal reflux. The stomach is distended. There is no evidence of gastric outlet obstruction. Oral contrast opacifies multiple loops of small bowel. No evidence of bowel obstruction or active inflammation. There is moderate stool throughout the colon. Normal appendix. Vascular/Lymphatic: There is moderate aortoiliac atherosclerotic disease. The IVC appears unremarkable. No portal venous gas identified. There is no adenopathy. Stable top-normal lymph node in the gastrohepatic ligament. Reproductive: Hysterectomy. Evaluation of the pelvis is limited due to streak artifact caused by bilateral hip arthroplasties. Other: Fat containing umbilical hernia. No inflammatory changes. No fluid collection. Musculoskeletal: Degenerative changes of the lower lumbar spine with facet hypertrophy. L5-S1 disc desiccation and vacuum phenomena. Bilateral hip arthroplasty changes. No acute fracture. IMPRESSION: 1. Distended and thickened gallbladder concerning for acute cholecystitis. Further evaluation with ultrasound recommended. 2. No bowel obstruction or active inflammation.  Normal appendix. 3.  Aortic Atherosclerosis (ICD10-I70.0). 4. Coronary vascular calcification. Electronically Signed   By: Elgie CollardArash  Radparvar M.D.   On: 11/11/2016 22:19   [place] Abdomen Limited Ruq  Result Date: 11/12/2016 CLINICAL DATA:  Abdominal pain tonight.  Abnormal gallbladder on CT. EXAM: ULTRASOUND ABDOMEN LIMITED RIGHT UPPER QUADRANT COMPARISON:  CT  abdomen and pelvis 11/11/2016. Abdominal ultrasound 07/15/2016 FINDINGS: Gallbladder: Tiny stones and sludge layering in the gallbladder. Stone measures about 2 mm. Diffuse gallbladder wall thickening with pericholecystic edema. Gallbladder wall measures up to about 8 mm diameter. Murphy's sign is negative. Common bile duct: Diameter: 4.4 mm, normal Liver: No focal lesion identified. Within normal limits in parenchymal echogenicity. IMPRESSION: Cholelithiasis and sludge in the gallbladder with diffuse gallbladder wall thickening and pericholecystic edema. Changes are suspicious for acute cholecystitis. Electronically Signed   By: Burman NievesWilliam  Stevens M.D.   On: 11/12/2016 02:00     EKG:  Not done in ED, will get one.   Assessment/Plan Principal Problem:   Acute cholecystitis Active Problems:   Hypertension   Gout   CAD (coronary artery disease)   GERD (gastroesophageal reflux disease)   HLD (hyperlipidemia)   Acute cholecystitis: The patient's abdominal pain is most likely caused by cholecystitis as evidenced by both CT scan and abdominal ultrasound. Patient does not have leukocytosis or fever,  clinically nonseptic. Hemodynamically stable. Gen. surgeon, Dr. Dwain SarnaWakefield was consulted.  -will admit to med-surg bed as inpt -IV zosyn -get Blood culture -prn Zofran for nausea and morphine for pain - INR/PTT/type & screen -Follow-up Gen. surgeon's recommendation  HTN: -IV hydralazine when necessary -Continue Coreg and nifedipine -hold Maxizde since pt needs IVF on NPO  GERD: -Protonix  Hx of CAD (coronary artery disease): s/p of stent -continue ASA, coreg -hold lipitor due to abnormal LFT -prn NTG  Gout: -continue home Colchicine  HLD: -hold lipitor due to abnormal LFT   DVT ppx: SCD Code Status: Full code Family Communication:  Yes, patient's daughter at bed side Disposition Plan:  Anticipate discharge back to previous home environment Consults called:  Gen. Careers advisersurgeon, Dr.  Dwain SarnaWakefield Admission status:  medical floor/inpt  Date of Service 11/12/2016    Lorretta HarpIU, Thaer Miyoshi [facility] Pager [phone]  If 7PM-7AM, please contact night-coverage [email or web address] Password [email or web address] 11/12/2016, 7:15 AM

## 2016-11-12 NOTE — Progress Notes (Signed)
Patient transported to surgery, pre-op checklist complete, report called to short stay, patient called daughter and family is on the way. IV fluids/antibiotics running upon leaving unit.

## 2016-11-12 NOTE — Progress Notes (Signed)
Called for report. RN unavailable.

## 2016-11-12 NOTE — ED Notes (Signed)
Family at bedside. 

## 2016-11-12 NOTE — Anesthesia Preprocedure Evaluation (Addendum)
Anesthesia Evaluation  Patient identified by MRN, date of birth, ID band Patient awake    Reviewed: Allergy & Precautions, NPO status , Patient's Chart, lab work & pertinent test results, reviewed documented beta blocker date and time   History of Anesthesia Complications Negative for: history of anesthetic complications  Airway Mallampati: II  TM Distance: >3 FB Neck ROM: Full    Dental  (+) Dental Advisory Given   Pulmonary neg pulmonary ROS,    breath sounds clear to auscultation       Cardiovascular hypertension, Pt. on medications and Pt. on home beta blockers (-) angina+ CAD, + Past MI and + Cardiac Stents   Rhythm:Regular Rate:Normal     Neuro/Psych negative neurological ROS     GI/Hepatic GERD  Medicated and Controlled,Elevated LFTs with acute chole   Endo/Other  Morbid obesity  Renal/GU negative Renal ROS     Musculoskeletal   Abdominal (+) + obese,   Peds  Hematology negative hematology ROS (+)   Anesthesia Other Findings   Reproductive/Obstetrics                            Anesthesia Physical Anesthesia Plan  ASA: III  Anesthesia Plan: General   Post-op Pain Management:    Induction: Intravenous  PONV Risk Score and Plan: 4 or greater and Ondansetron, Dexamethasone, Midazolam and Scopolamine patch - Pre-op  Airway Management Planned:   Additional Equipment:   Intra-op Plan:   Post-operative Plan: Extubation in OR  Informed Consent: I have reviewed the patients History and Physical, chart, labs and discussed the procedure including the risks, benefits and alternatives for the proposed anesthesia with the patient or authorized representative who has indicated his/her understanding and acceptance.   Dental advisory given  Plan Discussed with: CRNA and Surgeon  Anesthesia Plan Comments: (Plan routine monitors, GETA )        Anesthesia Quick Evaluation

## 2016-11-12 NOTE — Consult Note (Signed)
Reason for Consult:abd pain, gallstones Referring Physician: Dr Leonides Schanz  Robin Ali is an 76 y.o. female.  HPI: 46 yof who has history of cad/prior mi, htn, c diff after abx usage after tha who presents with episode of ruq pain after eating on Saturday night that resolved.  Then last 48 hours developed worsening ruq pain radiating to her back that has persisted.  She attempted some miralax with episodes but did not help . Nothing was relieving the pain until she received narcotics here.  No fevers.   Some constipation.  She has no n/v.  She presented to med center and underwent ct scan that showed distended thickened gb.  She was transferred here and underwent US that shows cholelithiasis and sludge with diffuse gbw thickening and 8 mm gb wall.  cbd is normal. lfts only with mildly elevated alt.  She is here with her daughter.  Has history of coronary stent placement, only on asa.    Past Medical History:  Diagnosis Date  . CAD (coronary artery disease)   . GERD (gastroesophageal reflux disease)   . Gout   . Heart attack ([place])   . Hiatal hernia   . Hypertension     Past Surgical History:  Procedure Laterality Date  . ABDOMINAL HYSTERECTOMY    . CORONARY ANGIOPLASTY WITH STENT PLACEMENT    . HIP SURGERY      Family History  Problem Relation Age of Onset  . Congestive Heart Failure Mother   . Pancreatic cancer Brother     Social History:  reports that she has never smoked. She has never used smokeless tobacco. She reports that she drinks alcohol. She reports that she does not use drugs.  Allergies:  Allergies  Allergen Reactions  . Ace Inhibitors   . Norvasc [Amlodipine Besylate]     Medications: I have reviewed the patient's current medications.  Results for orders placed or performed during the hospital encounter of 11/11/16 (from the past 48 hour(s))  CBC with Differential     Status: Abnormal   Collection Time: 11/11/16  7:28 PM  Result Value Ref Range   WBC 8.4 4.0  - 10.5 K/uL   RBC 5.16 (H) 3.87 - 5.11 MIL/uL   Hemoglobin 13.1 12.0 - 15.0 g/dL   HCT 36.2 36.0 - 46.0 %   MCV 70.2 (L) 78.0 - 100.0 fL   MCH 25.4 (L) 26.0 - 34.0 pg   MCHC 36.2 (H) 30.0 - 36.0 g/dL   RDW 16.9 (H) 11.5 - 15.5 %   Platelets 239 150 - 400 K/uL   Neutrophils Relative % 38 %   Neutro Abs 3.2 1.7 - 7.7 K/uL   Lymphocytes Relative 43 %   Lymphs Abs 3.6 0.7 - 4.0 K/uL   Monocytes Relative 13 %   Monocytes Absolute 1.1 (H) 0.1 - 1.0 K/uL   Eosinophils Relative 6 %   Eosinophils Absolute 0.5 0.0 - 0.7 K/uL   Basophils Relative 0 %   Basophils Absolute 0.0 0.0 - 0.1 K/uL  Comprehensive metabolic panel     Status: Abnormal   Collection Time: 11/11/16  7:28 PM  Result Value Ref Range   Sodium 139 135 - 145 mmol/L   Potassium 3.6 3.5 - 5.1 mmol/L   Chloride 102 101 - 111 mmol/L   CO2 26 22 - 32 mmol/L   Glucose, Bld 103 (H) 65 - 99 mg/dL   BUN 17 6 - 20 mg/dL   Creatinine, Ser 1.05 (H) 0.44 - 1.00 mg/dL  Calcium 9.5 8.9 - 10.3 mg/dL   Total Protein 7.9 6.5 - 8.1 g/dL   Albumin 3.9 3.5 - 5.0 g/dL   AST 33 15 - 41 U/L   ALT 57 (H) 14 - 54 U/L   Alkaline Phosphatase 155 (H) 38 - 126 U/L   Total Bilirubin 0.5 0.3 - 1.2 mg/dL   GFR calc non Af Amer 50 (L) >60 mL/min   GFR calc Af Amer 58 (L) >60 mL/min    Comment: (NOTE) The eGFR has been calculated using the CKD EPI equation. This calculation has not been validated in all clinical situations. eGFR's persistently <60 mL/min signify possible Chronic Kidney Disease.    Anion gap 11 5 - 15  Lipase, blood     Status: Abnormal   Collection Time: 11/11/16  7:28 PM  Result Value Ref Range   Lipase 52 (H) 11 - 51 U/L  Urinalysis, Routine w reflex microscopic     Status: None   Collection Time: 11/11/16  7:29 PM  Result Value Ref Range   Color, Urine YELLOW YELLOW   APPearance CLEAR CLEAR   Specific Gravity, Urine 1.005 1.005 - 1.030   pH 6.5 5.0 - 8.0   Glucose, UA NEGATIVE NEGATIVE mg/dL   Hgb urine dipstick  NEGATIVE NEGATIVE   Bilirubin Urine NEGATIVE NEGATIVE   Ketones, ur NEGATIVE NEGATIVE mg/dL   Protein, ur NEGATIVE NEGATIVE mg/dL   Nitrite NEGATIVE NEGATIVE   Leukocytes, UA NEGATIVE NEGATIVE    Comment: Microscopic not done on urines with negative protein, blood, leukocytes, nitrite, or glucose < 500 mg/dL.    Ct Abdomen Pelvis W Contrast  Result Date: 11/11/2016 CLINICAL DATA:  76 year old female with abdominal pain radiating to the back. EXAM: CT ABDOMEN AND PELVIS WITH CONTRAST TECHNIQUE: Multidetector CT imaging of the abdomen and pelvis was performed using the standard protocol following bolus administration of intravenous contrast. CONTRAST:  162m ISOVUE-300 IOPAMIDOL (ISOVUE-300) INJECTION 61% COMPARISON:  Abdominal CT dated 09/23/2014 FINDINGS: Lower chest: The visualized lung bases are clear. Multi vessel coronary vascular calcification. No intra-abdominal free air or free fluid. Hepatobiliary: The liver is unremarkable. No intrahepatic biliary ductal dilatation. The gallbladder is distended. There is thickened and edematous appearance of the wall of the gallbladder with enhancement of the gallbladder mucosa. No calcified stone identified. Ultrasound is recommended for better evaluation of the gallbladder. Pancreas: Unremarkable. No pancreatic ductal dilatation or surrounding inflammatory changes. Spleen: Normal in size without focal abnormality. Adrenals/Urinary Tract: A 9 mm indeterminate left adrenal nodule as seen on the prior CT, possibly an adenoma. The right adrenal gland is unremarkable. The kidneys, visualized ureters, and urinary bladder are grossly unremarkable. Stomach/Bowel: There is a small hiatal hernia with gastroesophageal reflux. The stomach is distended. There is no evidence of gastric outlet obstruction. Oral contrast opacifies multiple loops of small bowel. No evidence of bowel obstruction or active inflammation. There is moderate stool throughout the colon. Normal  appendix. Vascular/Lymphatic: There is moderate aortoiliac atherosclerotic disease. The IVC appears unremarkable. No portal venous gas identified. There is no adenopathy. Stable top-normal lymph node in the gastrohepatic ligament. Reproductive: Hysterectomy. Evaluation of the pelvis is limited due to streak artifact caused by bilateral hip arthroplasties. Other: Fat containing umbilical hernia. No inflammatory changes. No fluid collection. Musculoskeletal: Degenerative changes of the lower lumbar spine with facet hypertrophy. L5-S1 disc desiccation and vacuum phenomena. Bilateral hip arthroplasty changes. No acute fracture. IMPRESSION: 1. Distended and thickened gallbladder concerning for acute cholecystitis. Further evaluation with ultrasound recommended. 2. No  bowel obstruction or active inflammation.  Normal appendix. 3.  Aortic Atherosclerosis (ICD10-I70.0). 4. Coronary vascular calcification. Electronically Signed   By: Anner Crete M.D.   On: 11/11/2016 22:19   US Abdomen Limited Ruq  Result Date: 11/12/2016 CLINICAL DATA:  Abdominal pain tonight.  Abnormal gallbladder on CT. EXAM: ULTRASOUND ABDOMEN LIMITED RIGHT UPPER QUADRANT COMPARISON:  CT abdomen and pelvis 11/11/2016. Abdominal ultrasound 07/15/2016 FINDINGS: Gallbladder: Tiny stones and sludge layering in the gallbladder. Stone measures about 2 mm. Diffuse gallbladder wall thickening with pericholecystic edema. Gallbladder wall measures up to about 8 mm diameter. Murphy's sign is negative. Common bile duct: Diameter: 4.4 mm, normal Liver: No focal lesion identified. Within normal limits in parenchymal echogenicity. IMPRESSION: Cholelithiasis and sludge in the gallbladder with diffuse gallbladder wall thickening and pericholecystic edema. Changes are suspicious for acute cholecystitis. Electronically Signed   By: Lucienne Capers M.D.   On: 11/12/2016 02:00    Review of Systems  Gastrointestinal: Positive for abdominal pain and constipation.   All other systems reviewed and are negative.  Blood pressure (!) 158/78, pulse 63, temperature 98.1 F (36.7 C), temperature source Oral, resp. rate 18, height 5' 1"  (1.549 m), weight 76.2 kg (168 lb), SpO2 100 %. Physical Exam  Vitals reviewed. Constitutional: She is oriented to person, place, and time. She appears well-developed and well-nourished.  HENT:  Head: Normocephalic and atraumatic.  Right Ear: External ear normal.  Left Ear: External ear normal.  Nose: Nose normal.  Mouth/Throat: Oropharynx is clear and moist.  Eyes: Pupils are equal, round, and reactive to light. EOM are normal. No scleral icterus.  Neck: Neck supple.  Cardiovascular: Normal rate, regular rhythm, normal heart sounds and intact distal pulses.   Respiratory: Effort normal and breath sounds normal. She has no wheezes. She has no rales.  GI: Soft. Bowel sounds are normal. She exhibits no distension. There is tenderness (mild ruq tenderness) in the right upper quadrant. A hernia (umbilical) is present.  Musculoskeletal: Normal range of motion. She exhibits no edema or tenderness.  Lymphadenopathy:    She has no cervical adenopathy.  Neurological: She is alert and oriented to person, place, and time. She has normal strength.  Skin: Skin is warm and dry. She is not diaphoretic.  Psychiatric: She has a normal mood and affect. Judgment and thought content normal.    Assessment/Plan: Cholecystitis  It appears she has cholecystitis by exam (although much improved now) and she does have stones on [place].  I discussed admission, iv abx, npo.  Discussed lap chole as treatment for this.   Auset Fritzler 11/12/2016, 3:04 AM

## 2016-11-12 NOTE — Anesthesia Postprocedure Evaluation (Signed)
Anesthesia Post Note  Patient: Robin Ali  Procedure(s) Performed: Procedure(s) (LRB): LAPAROSCOPIC CHOLECYSTECTOMY (N/A)     Patient location during evaluation: PACU Anesthesia Type: General Level of consciousness: patient cooperative, sedated and oriented Pain management: pain level controlled Vital Signs Assessment: post-procedure vital signs reviewed and stable Respiratory status: spontaneous breathing, nonlabored ventilation, respiratory function stable and patient connected to nasal cannula oxygen Cardiovascular status: blood pressure returned to baseline and stable Postop Assessment: no signs of nausea or vomiting Anesthetic complications: no    Last Vitals:  Vitals:   11/12/16 1045 11/12/16 1050  BP:  (!) 130/59  Pulse: (!) 55 (!) 55  Resp: 12 13  Temp:      Last Pain:  Vitals:   11/12/16 1045  TempSrc:   PainSc: Asleep                 Kenniyah Sasaki,E. Camryn Quesinberry

## 2016-11-12 NOTE — Progress Notes (Signed)
11/11/2016  6:41 PM  11/12/2016 12:29 PM  Robin Ali was seen and examined.  The H&P by the admitting provider, orders, imaging was reviewed.  Please see new orders.  Will continue to follow.   Maryln Manuel. Burnis Halling, MD [facility]

## 2016-11-12 NOTE — ED Provider Notes (Signed)
12:30 AM  Pt is a 76 y.o. female who was sent [facility] for right upper quadrant ultrasound. She presented there with upper abdominal pain. Labs unremarkable other than mildly elevated ALT, alkaline phosphatase and elevated lipase. CT scan concerning for acute cholecystitis. Dr. Dwain SarnaWakefield was consulted. He recommended a formal right upper quadrant ultrasound. Patient is currently well-appearing, nontoxic, afebrile. Mildly tender throughout the upper abdomen with negative Murphy sign. She declines further pain and nausea medication. We will keep her NPO and obtain right upper quadrant ultrasound.  2:00 AM  Pt's ultrasound shows cholelithiasis with sludge and diffuse gallbladder wall thickening and pericholecystic edema concerning for acute cholecystitis. We'll discuss with general surgery on call. Will give IV Zosyn.   2:20 AM  Pt has history of hypertension, hyperlipidemia, CAD status post stent, previous bilateral hip replacements, previous history of C. difficile colitis after antibiotics. Discussed with Dr. Dwain SarnaWakefield who will see the patient in the emergency department but has requested medical admission given her age and multiple comorbidities. She is on aspirin 81 mg daily but no other antiplatelets or anticoagulants. At this time patient is hemodynamically stable.   2:34 AM Discussed patient's case with hospitalist, Dr. Clyde LundborgNiu.  I have recommended admission and patient (and family if present) agree with this plan. Admitting physician will place admission orders.   I reviewed all nursing notes, vitals, pertinent previous records, EKGs, lab and urine results, imaging (as available).     Ward, Layla MawKristen N, DO 11/12/16 [phone]

## 2016-11-12 NOTE — Op Note (Signed)
11/12/2016  9:49 AM  PATIENT:  Joel Bromwell  76 y.o. female  PRE-OPERATIVE DIAGNOSIS:  acute cholecystitis  POST-OPERATIVE DIAGNOSIS:  acute cholecystitis, cholelithiasis  PROCEDURE:  Procedure(s): LAPAROSCOPIC CHOLECYSTECTOMY (N/A)  SURGEON:  Surgeon(s) and Role:    Axel Filler* Harvin Konicek, MD - Primary  ANESTHESIA:   local and general  EBL:5cc   Total I/O In: 500 [I.V.:500] Out: -   BLOOD ADMINISTERED:none  DRAINS: none   LOCAL MEDICATIONS USED:  BUPIVICAINE   SPECIMEN:  Source of Specimen:  gallbladder  DISPOSITION OF SPECIMEN:  PATHOLOGY  COUNTS:  YES  TOURNIQUET:  * No tourniquets in log *  DICTATION: .Dragon Dictation  EBL: <5cc   Complications: none   Counts: reported as correct x 2   Findings:chronic inflammation of the gallbladder  Indications for procedure: Pt is a 76 y/o F with RUQ pain and seen to have gallstones and acute cholelithiasis.  Details of the procedure: The patient was taken to the operating and placed in the supine position with bilateral SCDs in place. A time out was called and all facts were verified. A pneumoperitoneum was obtained via A Veress needle technique to a pressure of 14mm of mercury. A 5mm trochar was then placed in the right upper quadrant under visualization, and there were no injuries to any abdominal organs. A 11 mm port was then placed in the umbilical region after infiltrating with local anesthesia under direct visualization. A second epigastric port was placed under direct visualization.   The gallbladder was identified and retracted, the peritoneum was then sharply dissected from the gallbladder and this dissection was carried down to Calot's triangle. The cystic duct was identified and dissected circumferentially and seen going into the gallbladder 360.  The cystic artery was dissected away from the surrounding tissues.   The critical angle was obtained  2 clips were placed proximally one distally and the cystic duct  transected. The cystic artery was identified and 2 clips placed proximally and one distally and transected. We then proceeded to remove the gallbladder off the hepatic fossa with Bovie cautery. A retrieval bag was then placed in the abdomen and gallbladder placed in the bag. The hepatic fossa was then reexamined and hemostasis was achieved with Bovie cautery and was excellent at this portion of the case. The subhepatic fossa and perihepatic fossa was then irrigated until the effluent was clear. The specimen bag and specimen were removed from the abdominal cavity.  The 11 mm trocar fascia was reapproximated with the PMI suture passe and a #1 Vicryl x2. The pneumoperitoneum was evacuated and all trochars removed under direct visulalization. The skin was then closed with 4-0 Monocryl and the skin dressed with Steri-Strips, gauze, and tape. The patient was awaken from general anesthesia and taken to the recovery room in stable condition.    PLAN OF CARE: Admit for overnight observation  PATIENT DISPOSITION:  PACU - hemodynamically stable.   Delay start of Pharmacological VTE agent (>24hrs) due to surgical blood loss or risk of bleeding: yes

## 2016-11-12 NOTE — ED Notes (Signed)
Pt transported to Ultrasound.  

## 2016-11-12 NOTE — ED Notes (Signed)
Attempted report x1. RN unavailable @ this time. 

## 2016-11-13 ENCOUNTER — Encounter (HOSPITAL_COMMUNITY): Payer: Self-pay | Admitting: General Surgery

## 2016-11-13 DIAGNOSIS — K219 Gastro-esophageal reflux disease without esophagitis: Secondary | ICD-10-CM

## 2016-11-13 DIAGNOSIS — I10 Essential (primary) hypertension: Secondary | ICD-10-CM | POA: Diagnosis not present

## 2016-11-13 DIAGNOSIS — K810 Acute cholecystitis: Secondary | ICD-10-CM | POA: Diagnosis not present

## 2016-11-13 LAB — BASIC METABOLIC PANEL
Anion gap: 8 (ref 5–15)
BUN: 17 mg/dL (ref 6–20)
CALCIUM: 8.6 mg/dL — AB (ref 8.9–10.3)
CO2: 22 mmol/L (ref 22–32)
CREATININE: 1.17 mg/dL — AB (ref 0.44–1.00)
Chloride: 110 mmol/L (ref 101–111)
GFR calc Af Amer: 51 mL/min — ABNORMAL LOW (ref >60)
GFR calc non Af Amer: 44 mL/min — ABNORMAL LOW (ref >60)
Glucose, Bld: 112 mg/dL — ABNORMAL HIGH (ref 65–99)
Potassium: 3.8 mmol/L (ref 3.5–5.1)
Sodium: 140 mmol/L (ref 135–145)

## 2016-11-13 LAB — GLUCOSE, CAPILLARY: Glucose-Capillary: 113 mg/dL — ABNORMAL HIGH (ref 65–99)

## 2016-11-13 MED ORDER — OXYCODONE HCL 5 MG PO TABS: 5.0000 mg | ORAL_TABLET | Freq: Four times a day (QID) | ORAL | 0 refills | Status: AC | PRN

## 2016-11-13 MED ORDER — HYDROMORPHONE HCL 1 MG/ML IJ SOLN: 1.0000 mg | INTRAMUSCULAR | Status: DC | PRN

## 2016-11-13 MED ORDER — ALUM & MAG HYDROXIDE-SIMETH 200-200-20 MG/5ML PO SUSP
30.0000 mL | ORAL | Status: DC | PRN
  Administered 2016-11-13: 30 mL via ORAL
  Filled 2016-11-13: qty 30

## 2016-11-13 MED ORDER — OXYCODONE HCL 5 MG PO TABS
5.0000 mg | ORAL_TABLET | ORAL | Status: DC | PRN
  Administered 2016-11-13: 5 mg via ORAL
  Filled 2016-11-13: qty 1

## 2016-11-13 MED ORDER — ENOXAPARIN SODIUM 40 MG/0.4ML ~~LOC~~ SOLN: 40.0000 mg | SUBCUTANEOUS | Status: DC

## 2016-11-13 NOTE — Progress Notes (Signed)
Patient ID: Robin HammersOphelia Ali, female   DOB: 05-Nov-1940, 76 y.o.   MRN: [id number]  [facility]  1 Day Post-Op  Subjective: CC- s/p lap chole Sitting up in bed eating breakfast. States that she is sore but feeling much better than prior to surgery. Denies n/v. Has ambulated to the bathroom.  Objective: Vital signs in last 24 hours: Temp:  [97.2 F (36.2 C)-98.3 F (36.8 C)] 98.2 F (36.8 C) (08/03 0653) Pulse Rate:  [54-70] 66 (08/03 0653) Resp:  [10-21] 18 (08/03 0653) BP: (111-150)/(56-70) 122/70 (08/03 0653) SpO2:  [95 %-100 %] 96 % (08/03 0653) Last BM Date: 11/11/16  Intake/Output from previous day: 08/02 0701 - 08/03 0700 In: 1100 [P.O.:300; I.V.:800] Out: 25 [Blood:25] Intake/Output this shift: No intake/output data recorded.  PE: Gen:  Alert, NAD, pleasant HEENT: EOM's intact, pupils equal and round Pulm: effort normal Abd: Soft, NT/ND, +BS, multiple lap incisions with C/D/I dressings Ext:  No erythema, edema, or tenderness BUE/BLE  Psych: A&Ox3  Skin: no rashes noted, warm and dry  Lab Results:   Recent Labs  11/11/16 1928 11/12/16 0448  WBC 8.4 9.5  HGB 13.1 12.6  HCT 36.2 36.4  PLT 239 201   BMET  Recent Labs  11/12/16 0448 11/13/16 0418  NA 140 140  K 3.1* 3.8  CL 104 110  CO2 27 22  GLUCOSE 106* 112*  BUN 12 17  CREATININE 1.00 1.17*  CALCIUM 9.0 8.6*   PT/INR  Recent Labs  11/12/16 0447  LABPROT 15.6*  INR 1.23   CMP     Component Value Date/Time   NA 140 11/13/2016 0418   K 3.8 11/13/2016 0418   CL 110 11/13/2016 0418   CO2 22 11/13/2016 0418   GLUCOSE 112 (H) 11/13/2016 0418   BUN 17 11/13/2016 0418   CREATININE 1.17 (H) 11/13/2016 0418   CALCIUM 8.6 (L) 11/13/2016 0418   PROT 7.9 11/11/2016 1928   ALBUMIN 3.9 11/11/2016 1928   AST 33 11/11/2016 1928   ALT 57 (H) 11/11/2016 1928   ALKPHOS 155 (H) 11/11/2016 1928   BILITOT 0.5 11/11/2016 1928   GFRNONAA 44 (L) 11/13/2016 0418   GFRAA 51 (L)  11/13/2016 0418   Lipase     Component Value Date/Time   LIPASE 52 (H) 11/11/2016 1928       Studies/Results: Ct Abdomen Pelvis W Contrast  Result Date: 11/11/2016 CLINICAL DATA:  76 year old female with abdominal pain radiating to the back. EXAM: CT ABDOMEN AND PELVIS WITH CONTRAST TECHNIQUE: Multidetector CT imaging of the abdomen and pelvis was performed using the standard protocol following bolus administration of intravenous contrast. CONTRAST:  100mL ISOVUE-300 IOPAMIDOL (ISOVUE-300) INJECTION 61% COMPARISON:  Abdominal CT dated 09/23/2014 FINDINGS: Lower chest: The visualized lung bases are clear. Multi vessel coronary vascular calcification. No intra-abdominal free air or free fluid. Hepatobiliary: The liver is unremarkable. No intrahepatic biliary ductal dilatation. The gallbladder is distended. There is thickened and edematous appearance of the wall of the gallbladder with enhancement of the gallbladder mucosa. No calcified stone identified. Ultrasound is recommended for better evaluation of the gallbladder. Pancreas: Unremarkable. No pancreatic ductal dilatation or surrounding inflammatory changes. Spleen: Normal in size without focal abnormality. Adrenals/Urinary Tract: A 9 mm indeterminate left adrenal nodule as seen on the prior CT, possibly an adenoma. The right adrenal gland is unremarkable. The kidneys, visualized ureters, and urinary bladder are grossly unremarkable. Stomach/Bowel: There is a small hiatal hernia with gastroesophageal reflux. The stomach is distended. There  is no evidence of gastric outlet obstruction. Oral contrast opacifies multiple loops of small bowel. No evidence of bowel obstruction or active inflammation. There is moderate stool throughout the colon. Normal appendix. Vascular/Lymphatic: There is moderate aortoiliac atherosclerotic disease. The IVC appears unremarkable. No portal venous gas identified. There is no adenopathy. Stable top-normal lymph node in the  gastrohepatic ligament. Reproductive: Hysterectomy. Evaluation of the pelvis is limited due to streak artifact caused by bilateral hip arthroplasties. Other: Fat containing umbilical hernia. No inflammatory changes. No fluid collection. Musculoskeletal: Degenerative changes of the lower lumbar spine with facet hypertrophy. L5-S1 disc desiccation and vacuum phenomena. Bilateral hip arthroplasty changes. No acute fracture. IMPRESSION: 1. Distended and thickened gallbladder concerning for acute cholecystitis. Further evaluation with ultrasound recommended. 2. No bowel obstruction or active inflammation.  Normal appendix. 3.  Aortic Atherosclerosis (ICD10-I70.0). 4. Coronary vascular calcification. Electronically Signed   By: Elgie CollardArash  Radparvar M.D.   On: 11/11/2016 22:19   [place] Abdomen Limited Ruq  Result Date: 11/12/2016 CLINICAL DATA:  Abdominal pain tonight.  Abnormal gallbladder on CT. EXAM: ULTRASOUND ABDOMEN LIMITED RIGHT UPPER QUADRANT COMPARISON:  CT abdomen and pelvis 11/11/2016. Abdominal ultrasound 07/15/2016 FINDINGS: Gallbladder: Tiny stones and sludge layering in the gallbladder. Stone measures about 2 mm. Diffuse gallbladder wall thickening with pericholecystic edema. Gallbladder wall measures up to about 8 mm diameter. Murphy's sign is negative. Common bile duct: Diameter: 4.4 mm, normal Liver: No focal lesion identified. Within normal limits in parenchymal echogenicity. IMPRESSION: Cholelithiasis and sludge in the gallbladder with diffuse gallbladder wall thickening and pericholecystic edema. Changes are suspicious for acute cholecystitis. Electronically Signed   By: Burman NievesWilliam  Stevens M.D.   On: 11/12/2016 02:00    Anti-infectives: Anti-infectives    Start     Dose/Rate Route Frequency Ordered Stop   11/12/16 0800  piperacillin-tazobactam (ZOSYN) IVPB 3.375 g  Status:  Discontinued     3.375 g 12.5 mL/hr over 240 Minutes Intravenous Every 8 hours 11/12/16 0251 11/12/16 1130   11/12/16 0215   piperacillin-tazobactam (ZOSYN) IVPB 3.375 g     3.375 g 100 mL/hr over 30 Minutes Intravenous  Once 11/12/16 0205 11/12/16 0311       Assessment/Plan acute cholecystitis, cholelithiasis S/p lap chole 8/2 Dr. Derrell Lollingamirez - POD 1 - tolerating diet, pain well controlled  ID - zosyn 8/2>>8/2 FEN - soft diet, advance as tolerated VTE - SCDs, lovenox  Plan - patient is ready for discharge from surgical standpoint. She will follow-up in clinic in 2-3 weeks. Pain med rx on chart.   LOS: 1 day    Franne FortsBROOKE A MEUTH , [facility] 11/13/2016, 8:58 AM Pager: [phone] Consults: [phone] Mon-Fri 7:00 am-4:30 pm Sat-Sun 7:00 am-11:30 am

## 2016-11-13 NOTE — Progress Notes (Signed)
Robin Ali to be D/C'd  per MD order. Discussed with the patient and all questions fully answered.  VSS, Skin clean, dry and intact without evidence of skin break down, no evidence of skin tears noted.  IV catheter discontinued intact. Site without signs and symptoms of complications. Dressing and pressure applied.  An After Visit Summary was printed and given to the patient. Patient received prescription.  D/c education completed with patient/family including follow up instructions, medication list, d/c activities limitations if indicated, with other d/c instructions as indicated by MD - patient able to verbalize understanding, all questions fully answered.   Patient instructed to return to ED, call 911, or call MD for any changes in condition.   Patient to be escorted via WC, and D/C home via private auto.

## 2016-11-13 NOTE — Discharge Instructions (Signed)
Cholecystostomy Cholecystostomy is a procedure to drain fluid from the gallbladder by using a flexible tube (catheter). The gallbladder is a pear-shaped organ that lies beneath the liver on the right side of the body. The gallbladder stores bile, which is a fluid that helps the body to digest fats. You may have this procedure:  If your gallbladder is swollen or irritated due to bile build up.  To prepare you for gallbladder surgery.  To control your symptoms if you cannot have gallbladder surgery.  Tell a health care provider about:  Any allergies you have.  All medicines you are taking, including vitamins, herbs, eye drops, creams, and over-the-counter medicines.  Any problems you or family members have had with anesthetic medicines.  Any blood disorders you have.  Any surgeries you have had.  Any medical conditions you have.  Whether you are pregnant or may be pregnant. What are the risks? Generally, this is a safe procedure. However, problems may occur, including:  The catheter moving out of place.  Clogging of the catheter.  Infection of the incision site.  Internal bleeding.  Puncture of the gallbladder. This can cause the bile to leak.  Infection inside the abdomen (peritonitis).  Damage to other structures or organs.  Low blood pressure and slowed heart rate.  Allergic reactions to medicines or dyes.  What happens before the procedure?  You may need to have tests, including: ? Imaging studies of your gallbladder. ? Blood tests.  Follow instructions from your health care provider about eating or drinking restrictions.  Do not use tobacco products, including cigarettes, chewing tobacco, or e-cigarettes, as told by your health care provider. If you need help quitting, ask your health care provider.  Ask your health care provider about: ? Changing or stopping your regular medicines. This is especially important if you are taking diabetes medicines or blood  thinners. ? Taking medicines such as aspirin and ibuprofen. These medicines can thin your blood. Do not take these medicines before your procedure if your health care provider instructs you not to.  Plan to have someone take you home after the procedure.  If you go home right after the procedure, plan to have someone with you for 24 hours.  Ask your health care provider how your surgical site will be marked or identified.  You may be given antibiotic medicine to help prevent infection. What happens during the procedure?  To reduce your risk of infection: ? Your health care team will wash or sanitize their hands. ? Your skin will be washed with soap.  An IV tube will be inserted into one of your veins.  You will be given one or more of the following: ? A medicine to help you relax (sedative). ? A medicine to numb the area (local anesthetic).  A small incision will be made in your abdomen.  A long needle or a wide puncturing tool (trocar) will be put through the incision.  Your health care provider will use an imaging study (ultrasound) to guide the needle or trocar into your gallbladder.  After the needle or trocar is in your gallbladder, a small amount of dye may be injected. An X-ray may be taken to make sure that the needle or trocar is in the correct place.  A catheter will be placed through the needle or trocar.  The catheter will be secured to your skin with stitches (sutures).  The catheter will be connected to a drainage bag. Fluid will drain from the gallbladder into   the bag. Some of this bile may be sent to the lab to be examined.  A bandage (dressing) will be placed over the incision. The procedure may vary among health care providers and hospitals. What happens after the procedure?  Your blood pressure, heart rate, breathing rate, and blood oxygen level will be monitored often until the medicines you were given have worn off.  Dye may be injected through your  catheter to check the catheter and your gallbladder.  Your gallbladder may be flushed out (irrigated) through the catheter.  Your catheter and drainage bag may need to stay in place for several weeks or as told by your health care provider. This information is not intended to replace advice given to you by your health care provider. Make sure you discuss any questions you have with your health care provider. Document Released: 06/26/2008 Document Revised: 09/05/2015 Document Reviewed: 07/11/2014 [employer] Interactive Patient Education  2018 [employer]  

## 2016-11-13 NOTE — Discharge Summary (Signed)
Physician Discharge Summary  Robin Ali ZOX:[id number] DOB: 1941-04-03 DOA: 11/11/2016  PCP: Leola BrazilEverly, Rebecca B, DO  Admit date: 11/11/2016 Discharge date: 11/13/2016  Admitted From: HOME  Disposition: HOME   Recommendations for Outpatient Follow-up:  1. Follow up with PCP in 1 weeks 2. Follow up with surgeon in 2 weeks 3. Please obtain BMP/CBC in one week  Discharge Condition: STABLE  CODE STATUS: FULL    Brief Hospitalization Summary: Please see all hospital notes, images, labs for full details of the hospitalization.  HPI: Robin Ali is a 76 y.o. female with medical history significant of hypertension, GERD, gout, CAD, s/p of stent placement, hiatal hernia, who presents with abdominal pain.  Patient states that she has been having abdominal pain in the past 5 days. It is located in the upper abdomen, constant, aching, tingling out of 10 in severity, radiating to the back. It is not associated with nausea, vomiting or diarrhea. No fever or chills. Last bowel movement was yesterday morning. Patient denies chest pain, SOB, cough, symptoms of UTI or unilateral weakness.  ED Course: pt was found to have lipase 52, WBC 8.4, negative urinalysis, abnormal liver functions with AST 33, ALT 57, ALP 152, total bilirubin 0.5, electrolytes renal function okay, temperature 99.1, tachycardia, O2 sat 100% on room air. CT abdomen/pelvis and abdominal ultrasound showed cholecystitis. Patient is admitted to the MedSurg bed as inpatient. Gen. surgeon, Dr. Dwain SarnaWakefield was consulted.  acute cholecystitis, cholelithiasis S/p lap chole 8/2 Dr. Derrell Lollingamirez - POD 1 - tolerating diet, pain well controlled  ID - zosyn 8/2>>8/2 FEN - soft diet, advance as tolerated VTE - SCDs, lovenox  Plan - patient is ready for discharge from surgical standpoint. She will follow-up in clinic in 2-3 weeks. Pain med rx on chart.  Discharge Diagnoses:  Principal Problem:   Acute cholecystitis Active Problems:    Hypertension   Gout   CAD (coronary artery disease)   GERD (gastroesophageal reflux disease)   HLD (hyperlipidemia)    Discharge Instructions: Discharge Instructions    Call MD for:  difficulty breathing, headache or visual disturbances    Complete by:  As directed    Call MD for:  extreme fatigue    Complete by:  As directed    Call MD for:  persistant dizziness or light-headedness    Complete by:  As directed    Call MD for:  persistant nausea and vomiting    Complete by:  As directed    Call MD for:  severe uncontrolled pain    Complete by:  As directed    Call MD for:  temperature >100.4    Complete by:  As directed    Increase activity slowly    Complete by:  As directed      Allergies as of 11/13/2016      Reactions   Ace Inhibitors Cough   Norvasc [amlodipine Besylate] Swelling      Medication List    STOP taking these medications   triamterene-hydrochlorothiazide 37.5-25 MG tablet Commonly known as:  MAXZIDE-25     TAKE these medications   aspirin EC 81 MG tablet Take 81 mg by mouth daily.   atorvastatin 40 MG tablet Commonly known as:  LIPITOR Take 40 mg by mouth daily.   carvedilol 25 MG tablet Commonly known as:  COREG Take 25 mg by mouth 2 (two) times daily with a meal.   colchicine 0.6 MG tablet Take 0.6 mg by mouth daily.   NIFEdipine 60 MG 24 hr  tablet Commonly known as:  PROCARDIA XL/ADALAT-CC Take 60 mg by mouth daily.   nitroGLYCERIN 0.4 MG SL tablet Commonly known as:  NITROSTAT Place 0.4 mg under the tongue every 5 (five) minutes as needed for chest pain.   oxyCODONE 5 MG immediate release tablet Commonly known as:  Oxy IR/ROXICODONE Take 1-2 tablets (5-10 mg total) by mouth every 6 (six) hours as needed for moderate pain or severe pain.   pantoprazole 40 MG tablet Commonly known as:  PROTONIX Take 40 mg by mouth daily.      Follow-up Information    Leola Brazilverly, Rebecca B, DO. Schedule an appointment as soon as possible for a visit  in 1 week(s).   Specialty:  Internal Medicine Why:  Hospital Follow UP  Contact information: [address] [place] [address] [phone]        Axel Filleramirez, Armando, MD. Schedule an appointment as soon as possible for a visit in 2 week(s).   Specialty:  General Surgery Why:  Hospital Follow Up  Contact information: [address] [place] [address] [phone]          Allergies  Allergen Reactions  . Ace Inhibitors Cough  . Norvasc [Amlodipine Besylate] Swelling   Current Discharge Medication List    START taking these medications   Details  oxyCODONE (OXY IR/ROXICODONE) 5 MG immediate release tablet Take 1-2 tablets (5-10 mg total) by mouth every 6 (six) hours as needed for moderate pain or severe pain. Qty: 20 tablet, Refills: 0      CONTINUE these medications which have NOT CHANGED   Details  aspirin EC 81 MG tablet Take 81 mg by mouth daily.    atorvastatin (LIPITOR) 40 MG tablet Take 40 mg by mouth daily.    carvedilol (COREG) 25 MG tablet Take 25 mg by mouth 2 (two) times daily with a meal.    colchicine 0.6 MG tablet Take 0.6 mg by mouth daily.    NIFEdipine (PROCARDIA XL/ADALAT-CC) 60 MG 24 hr tablet Take 60 mg by mouth daily.    nitroGLYCERIN (NITROSTAT) 0.4 MG SL tablet Place 0.4 mg under the tongue every 5 (five) minutes as needed for chest pain.    pantoprazole (PROTONIX) 40 MG tablet Take 40 mg by mouth daily.      STOP taking these medications     triamterene-hydrochlorothiazide (MAXZIDE-25) 37.5-25 MG tablet         Procedures/Studies: Ct Abdomen Pelvis W Contrast  Result Date: 11/11/2016 CLINICAL DATA:  76 year old female with abdominal pain radiating to the back. EXAM: CT ABDOMEN AND PELVIS WITH CONTRAST TECHNIQUE: Multidetector CT imaging of the abdomen and pelvis was performed using the standard protocol following bolus administration of intravenous contrast. CONTRAST:  100mL ISOVUE-300 IOPAMIDOL  (ISOVUE-300) INJECTION 61% COMPARISON:  Abdominal CT dated 09/23/2014 FINDINGS: Lower chest: The visualized lung bases are clear. Multi vessel coronary vascular calcification. No intra-abdominal free air or free fluid. Hepatobiliary: The liver is unremarkable. No intrahepatic biliary ductal dilatation. The gallbladder is distended. There is thickened and edematous appearance of the wall of the gallbladder with enhancement of the gallbladder mucosa. No calcified stone identified. Ultrasound is recommended for better evaluation of the gallbladder. Pancreas: Unremarkable. No pancreatic ductal dilatation or surrounding inflammatory changes. Spleen: Normal in size without focal abnormality. Adrenals/Urinary Tract: A 9 mm indeterminate left adrenal nodule as seen on the prior CT, possibly an adenoma. The right adrenal gland is unremarkable. The kidneys, visualized ureters, and urinary bladder are grossly  unremarkable. Stomach/Bowel: There is a small hiatal hernia with gastroesophageal reflux. The stomach is distended. There is no evidence of gastric outlet obstruction. Oral contrast opacifies multiple loops of small bowel. No evidence of bowel obstruction or active inflammation. There is moderate stool throughout the colon. Normal appendix. Vascular/Lymphatic: There is moderate aortoiliac atherosclerotic disease. The IVC appears unremarkable. No portal venous gas identified. There is no adenopathy. Stable top-normal lymph node in the gastrohepatic ligament. Reproductive: Hysterectomy. Evaluation of the pelvis is limited due to streak artifact caused by bilateral hip arthroplasties. Other: Fat containing umbilical hernia. No inflammatory changes. No fluid collection. Musculoskeletal: Degenerative changes of the lower lumbar spine with facet hypertrophy. L5-S1 disc desiccation and vacuum phenomena. Bilateral hip arthroplasty changes. No acute fracture. IMPRESSION: 1. Distended and thickened gallbladder concerning for acute  cholecystitis. Further evaluation with ultrasound recommended. 2. No bowel obstruction or active inflammation.  Normal appendix. 3.  Aortic Atherosclerosis (ICD10-I70.0). 4. Coronary vascular calcification. Electronically Signed   By: Elgie Collard M.D.   On: 11/11/2016 22:19   US Abdomen Limited Ruq  Result Date: 11/12/2016 CLINICAL DATA:  Abdominal pain tonight.  Abnormal gallbladder on CT. EXAM: ULTRASOUND ABDOMEN LIMITED RIGHT UPPER QUADRANT COMPARISON:  CT abdomen and pelvis 11/11/2016. Abdominal ultrasound 07/15/2016 FINDINGS: Gallbladder: Tiny stones and sludge layering in the gallbladder. Stone measures about 2 mm. Diffuse gallbladder wall thickening with pericholecystic edema. Gallbladder wall measures up to about 8 mm diameter. Murphy's sign is negative. Common bile duct: Diameter: 4.4 mm, normal Liver: No focal lesion identified. Within normal limits in parenchymal echogenicity. IMPRESSION: Cholelithiasis and sludge in the gallbladder with diffuse gallbladder wall thickening and pericholecystic edema. Changes are suspicious for acute cholecystitis. Electronically Signed   By: Burman Nieves M.D.   On: 11/12/2016 02:00      Subjective: Pt asking to go home, feels better, eating and drinking.   Discharge Exam: Vitals:   11/13/16 0151 11/13/16 0653  BP: 129/69 122/70  Pulse: 65 66  Resp: 17 18  Temp: 98.3 F (36.8 C) 98.2 F (36.8 C)   Vitals:   11/12/16 1421 11/12/16 2117 11/13/16 0151 11/13/16 0653  BP: (!) 150/69 (!) 111/58 129/69 122/70  Pulse: (!) 58 70 65 66  Resp: 17 16 17 18   Temp: 97.6 F (36.4 C) 98.3 F (36.8 C) 98.3 F (36.8 C) 98.2 F (36.8 C)  TempSrc: Oral Oral Oral   SpO2: 100% 96% 95% 96%  Weight:      Height:       General: Pt is alert, awake, not in acute distress Cardiovascular: RRR, S1/S2 +, no rubs, no gallops Respiratory: CTA bilaterally, no wheezing, no rhonchi Abdominal: wounds c/d/i, soft, NT, ND, bowel sounds + Extremities: no edema, no  cyanosis   The results of significant diagnostics from this hospitalization (including imaging, microbiology, ancillary and laboratory) are listed below for reference.     Microbiology: Recent Results (from the past 240 hour(s))  Surgical pcr screen     Status: None   Collection Time: 11/12/16  6:00 AM  Result Value Ref Range Status   MRSA, PCR NEGATIVE NEGATIVE Final   Staphylococcus aureus NEGATIVE NEGATIVE Final    Comment:        The Xpert SA Assay (FDA approved for NASAL specimens in patients over 22 years of age), is one component of a comprehensive surveillance program.  Test performance has been validated by [facility] for patients greater than or equal to 95 year old. It is not intended to diagnose  infection nor to guide or monitor treatment.      Labs: BNP (last 3 results) No results for input(s): BNP in the last 8760 hours. Basic Metabolic Panel:  Recent Labs Lab 11/11/16 1928 11/12/16 0448 11/12/16 1247 11/13/16 0418  NA 139 140  --  140  K 3.6 3.1*  --  3.8  CL 102 104  --  110  CO2 26 27  --  22  GLUCOSE 103* 106*  --  112*  BUN 17 12  --  17  CREATININE 1.05* 1.00  --  1.17*  CALCIUM 9.5 9.0  --  8.6*  MG  --   --  1.7  --    Liver Function Tests:  Recent Labs Lab 11/11/16 1928  AST 33  ALT 57*  ALKPHOS 155*  BILITOT 0.5  PROT 7.9  ALBUMIN 3.9    Recent Labs Lab 11/11/16 1928  LIPASE 52*   No results for input(s): AMMONIA in the last 168 hours. CBC:  Recent Labs Lab 11/11/16 1928 11/12/16 0448  WBC 8.4 9.5  NEUTROABS 3.2  --   HGB 13.1 12.6  HCT 36.2 36.4  MCV 70.2* 71.0*  PLT 239 201   Cardiac Enzymes: No results for input(s): CKTOTAL, CKMB, CKMBINDEX, TROPONINI in the last 168 hours. BNP: Invalid input(s): POCBNP CBG:  Recent Labs Lab 11/12/16 0745 11/13/16 0755  GLUCAP 130* 113*   D-Dimer No results for input(s): DDIMER in the last 72 hours. Hgb A1c No results for input(s): HGBA1C in the last 72  hours. Lipid Profile No results for input(s): CHOL, HDL, LDLCALC, TRIG, CHOLHDL, LDLDIRECT in the last 72 hours. Thyroid function studies No results for input(s): TSH, T4TOTAL, T3FREE, THYROIDAB in the last 72 hours.  Invalid input(s): FREET3 Anemia work up No results for input(s): VITAMINB12, FOLATE, FERRITIN, TIBC, IRON, RETICCTPCT in the last 72 hours. Urinalysis    Component Value Date/Time   COLORURINE YELLOW 11/11/2016 1929   APPEARANCEUR CLEAR 11/11/2016 1929   LABSPEC 1.005 11/11/2016 1929   PHURINE 6.5 11/11/2016 1929   GLUCOSEU NEGATIVE 11/11/2016 1929   HGBUR NEGATIVE 11/11/2016 1929   BILIRUBINUR NEGATIVE 11/11/2016 1929   KETONESUR NEGATIVE 11/11/2016 1929   PROTEINUR NEGATIVE 11/11/2016 1929   NITRITE NEGATIVE 11/11/2016 1929   LEUKOCYTESUR NEGATIVE 11/11/2016 1929   Sepsis Labs Invalid input(s): PROCALCITONIN,  WBC,  LACTICIDVEN Microbiology Recent Results (from the past 240 hour(s))  Surgical pcr screen     Status: None   Collection Time: 11/12/16  6:00 AM  Result Value Ref Range Status   MRSA, PCR NEGATIVE NEGATIVE Final   Staphylococcus aureus NEGATIVE NEGATIVE Final    Comment:        The Xpert SA Assay (FDA approved for NASAL specimens in patients over 2 years of age), is one component of a comprehensive surveillance program.  Test performance has been validated by [facility] for patients greater than or equal to 44 year old. It is not intended to diagnose infection nor to guide or monitor treatment.     Time coordinating discharge:   SIGNED:  Standley Dakins, MD  [facility] 11/13/2016, 12:03 PM Pager [phone]  If 7PM-7AM, please contact night-coverage [email or web address] Password [email or web address]

## 2016-11-13 NOTE — Care Management CC44 (Signed)
Condition Code 44 Documentation Completed  Patient Details  Name: Robin HammersOphelia Ali MRN: [id number] Date of Birth: [birth year]   Condition Code 44 given:   yes Patient signature on Condition Code 44 notice:   yes Documentation of 2 MD's agreement:   yes Code 44 added to claim:   yes    Kingsley PlanWile, Elleah Hemsley Marie, RN 11/13/2016, 11:12 AM

## 2016-11-17 LAB — CULTURE, BLOOD (ROUTINE X 2)
CULTURE: NO GROWTH
CULTURE: NO GROWTH
Special Requests: ADEQUATE
Special Requests: ADEQUATE

## 2018-03-13 IMAGING — CT CT ABD-PELV W/ CM
2 of 5 series · 15 of 46 positions shown, 17 images · IV contrast (APPLIED)
Comparison: Abdominal CT dated 09/23/2014

CLINICAL DATA: 76-year-old female with abdominal pain radiating to
the back.

EXAM:
CT ABDOMEN AND PELVIS WITH CONTRAST
TECHNIQUE: Multidetector CT imaging of the abdomen and pelvis was performed
using the standard protocol following bolus administration of
intravenous contrast.
CONTRAST:  100mL 0H2OQG-VTT IOPAMIDOL (0H2OQG-VTT) INJECTION 61%

[Series 2: axial st · axial · 0.73mm/px · z∈[-426,-16]mm · 12 of 94 slices shown, 14 images]
[im 6/94  soft-tissue]
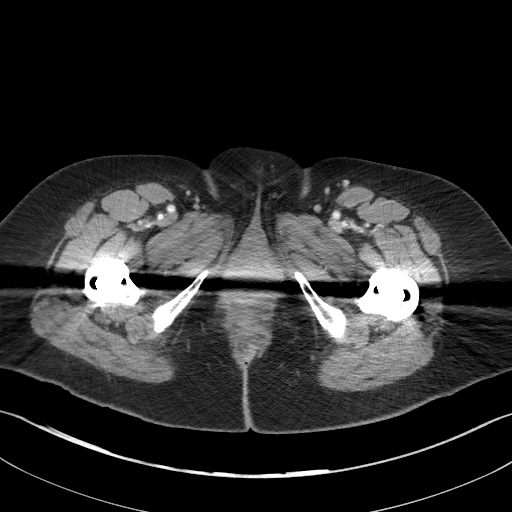
[im 6/94  bone]
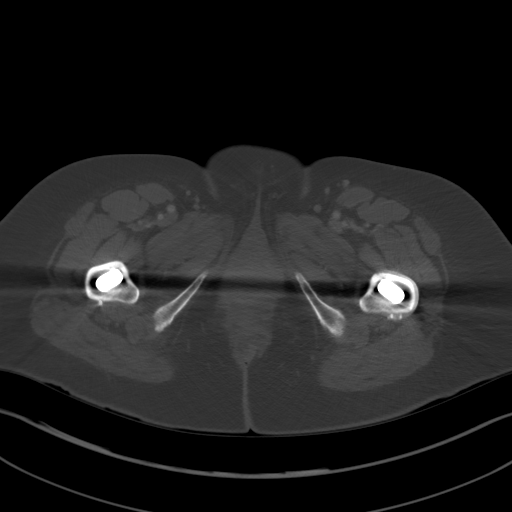
[im 16/94  soft-tissue]
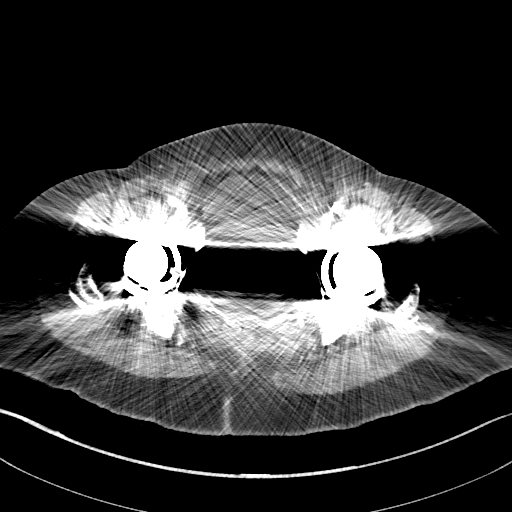
[im 21/94  soft-tissue]
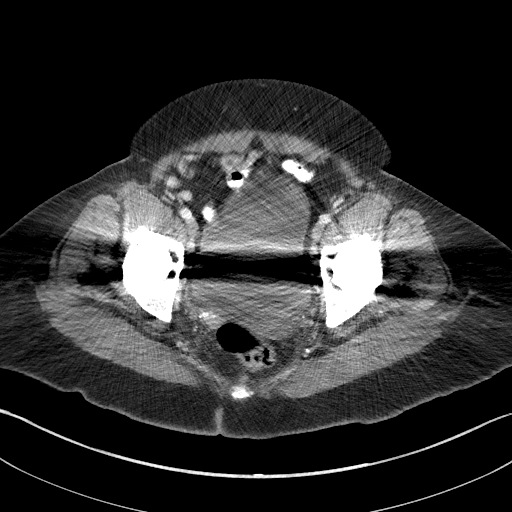
[im 26/94  soft-tissue]
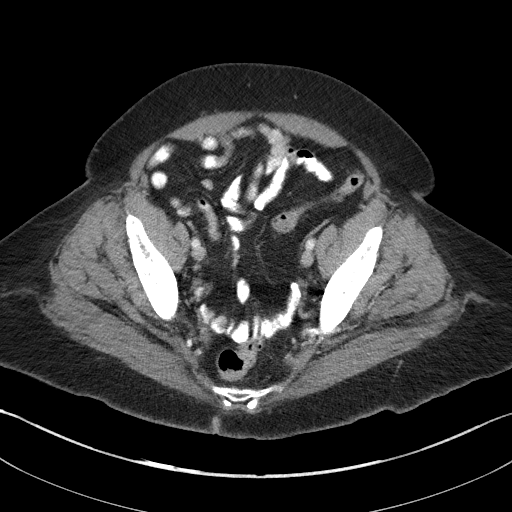
[im 37/94  soft-tissue]
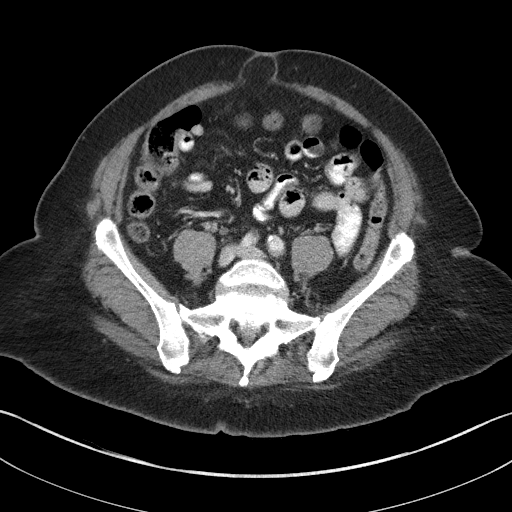
[im 42/94  soft-tissue]
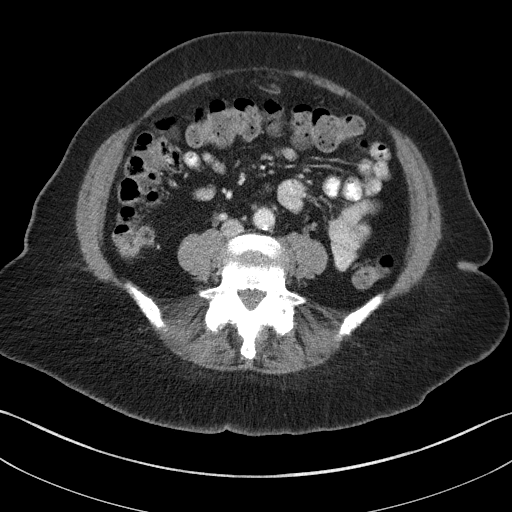
[im 52/94  soft-tissue]
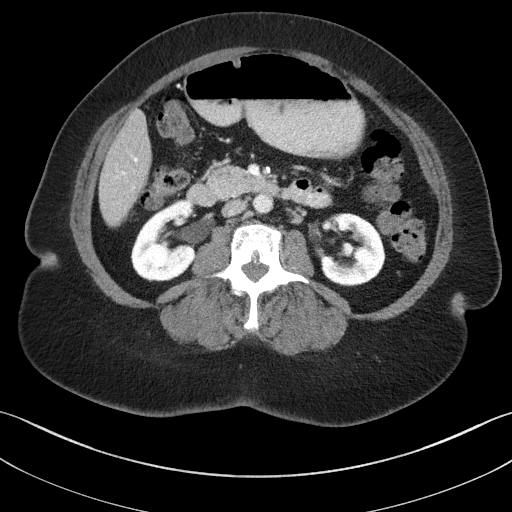
[im 57/94  soft-tissue]
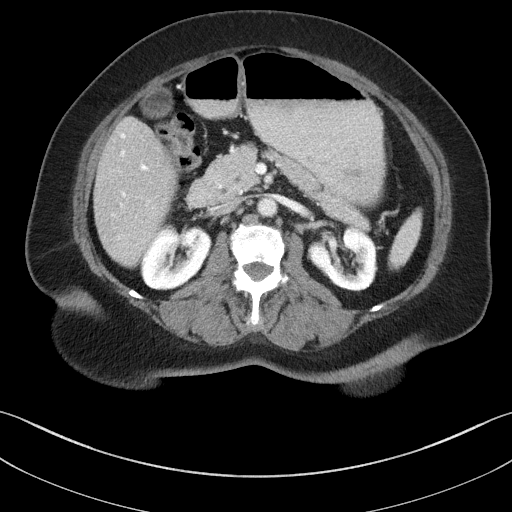
[im 68/94  soft-tissue]
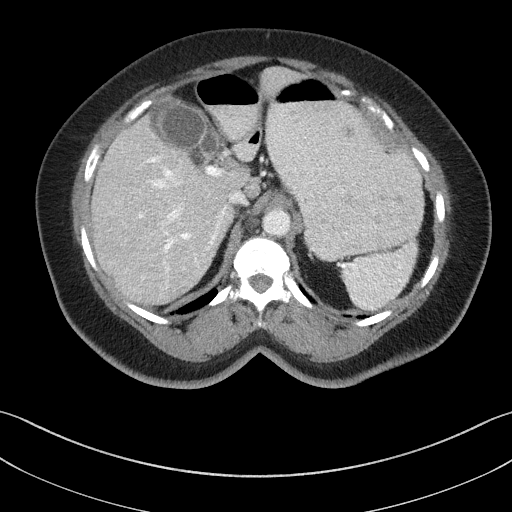
[im 68/94  bone]
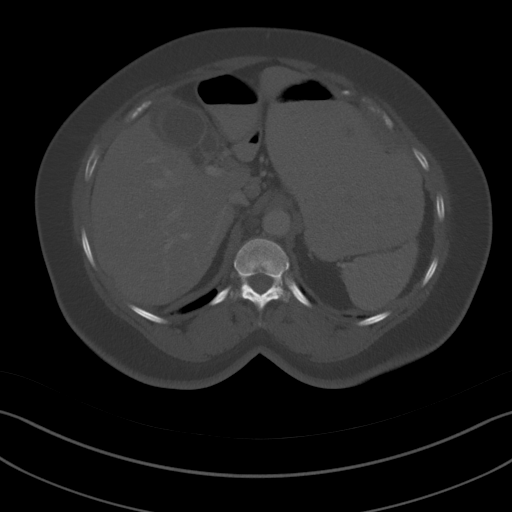
[im 73/94  soft-tissue]
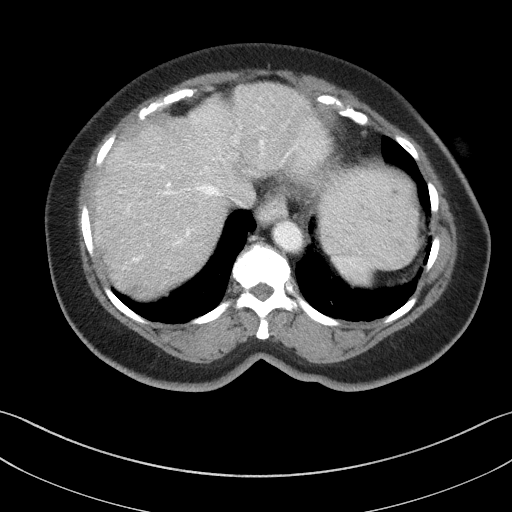
[im 78/94  soft-tissue]
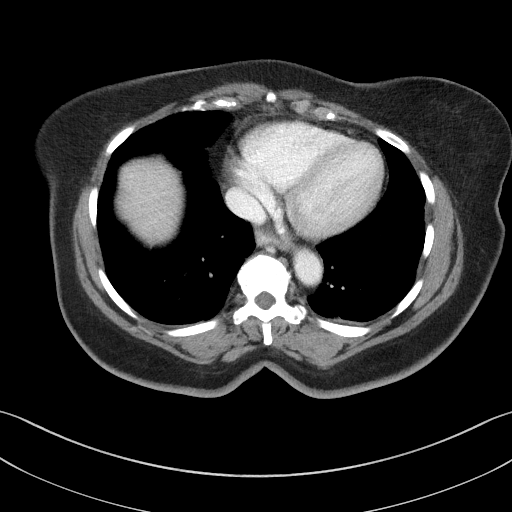
[im 88/94  soft-tissue]
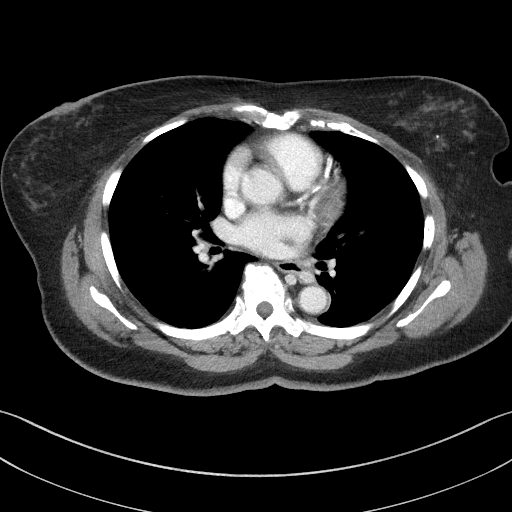

[Series 5: coronal st · coronal · 0.76mm/px · 3 of 106 slices shown]
[im 36/106  soft-tissue]
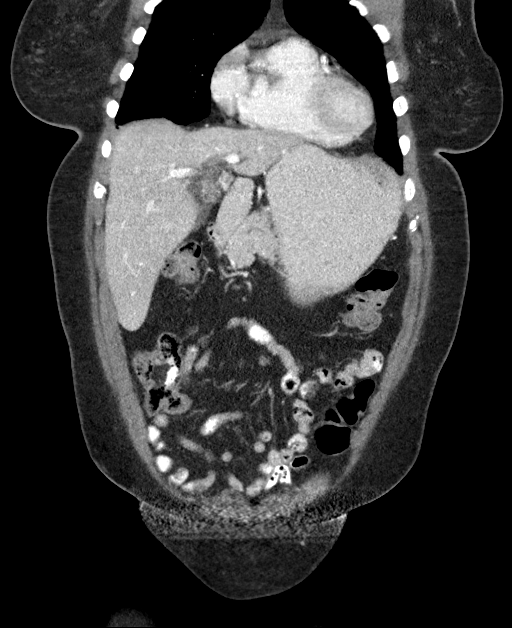
[im 47/106  soft-tissue]
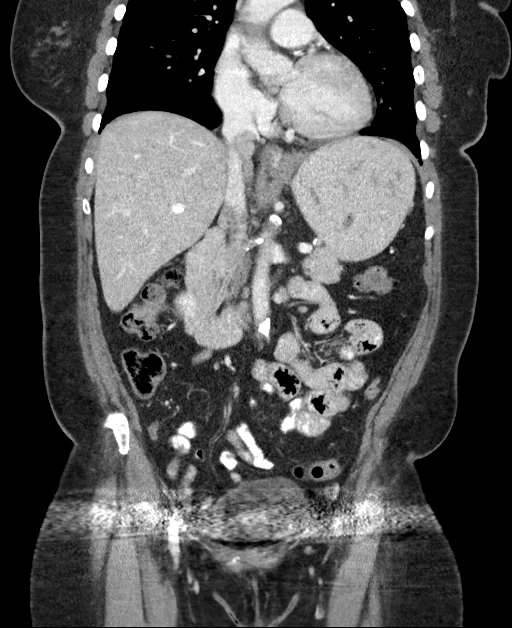
[im 59/106  soft-tissue]
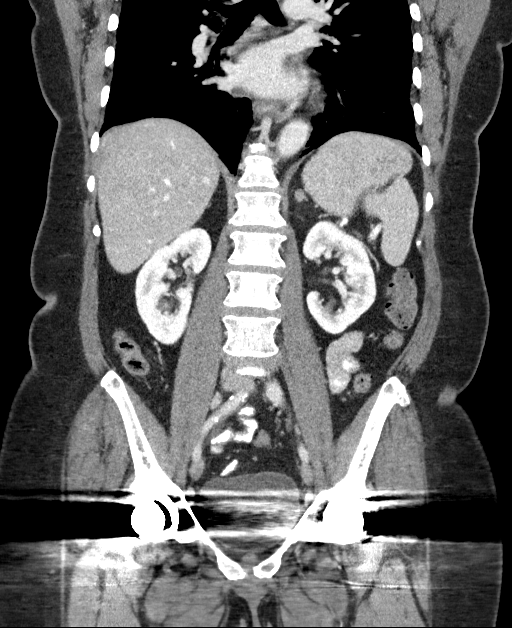

[15 of 46 positions shown; findings below may reference images not displayed]

FINDINGS: Lower chest: The visualized lung bases are clear. Multi vessel
coronary vascular calcification.

No intra-abdominal free air or free fluid.

Hepatobiliary: The liver is unremarkable. No intrahepatic biliary
ductal dilatation. The gallbladder is distended. There is thickened
and edematous appearance of the wall of the gallbladder with
enhancement of the gallbladder mucosa. No calcified stone
identified. Ultrasound is recommended for better evaluation of the
gallbladder.

Pancreas: Unremarkable. No pancreatic ductal dilatation or
surrounding inflammatory changes.

Spleen: Normal in size without focal abnormality.

Adrenals/Urinary Tract: A 9 mm indeterminate left adrenal nodule as
seen on the prior CT, possibly an adenoma. The right adrenal gland
is unremarkable. The kidneys, visualized ureters, and urinary
bladder are grossly unremarkable.

Stomach/Bowel: There is a small hiatal hernia with gastroesophageal
reflux. The stomach is distended. There is no evidence of gastric
outlet obstruction. Oral contrast opacifies multiple loops of small
bowel. No evidence of bowel obstruction or active inflammation.
There is moderate stool throughout the colon. Normal appendix.

Vascular/Lymphatic: There is moderate aortoiliac atherosclerotic
disease. The IVC appears unremarkable. No portal venous gas
identified. There is no adenopathy. Stable top-normal lymph node in
the gastrohepatic ligament.

Reproductive: Hysterectomy. Evaluation of the pelvis is limited due
to streak artifact caused by bilateral hip arthroplasties.

Other: Fat containing umbilical hernia. No inflammatory changes. No
fluid collection.

Musculoskeletal: Degenerative changes of the lower lumbar spine with
facet hypertrophy. L5-S1 disc desiccation and vacuum phenomena.
Bilateral hip arthroplasty changes. No acute fracture.
IMPRESSION: 1. Distended and thickened gallbladder concerning for acute
cholecystitis. Further evaluation with ultrasound recommended.
2. No bowel obstruction or active inflammation.  Normal appendix.
3.  Aortic Atherosclerosis ([id number]-GDA.A).
4. Coronary vascular calcification.

## 2022-07-13 HISTORY — PX: VAGINAL PROLAPSE REPAIR: SHX830

## 2022-12-01 ENCOUNTER — Encounter (HOSPITAL_BASED_OUTPATIENT_CLINIC_OR_DEPARTMENT_OTHER): Payer: Self-pay

## 2022-12-01 ENCOUNTER — Other Ambulatory Visit: Payer: Self-pay

## 2022-12-01 ENCOUNTER — Ambulatory Visit (HOSPITAL_BASED_OUTPATIENT_CLINIC_OR_DEPARTMENT_OTHER)
Admission: RE | Admit: 2022-12-01 | Discharge: 2022-12-01 | Disposition: A | Discharge status: Home / Self Care | Payer: 59 | Source: Ambulatory Visit | Attending: Emergency Medicine | Admitting: Emergency Medicine

## 2022-12-01 ENCOUNTER — Other Ambulatory Visit (HOSPITAL_BASED_OUTPATIENT_CLINIC_OR_DEPARTMENT_OTHER): Payer: Self-pay | Admitting: Emergency Medicine

## 2022-12-01 ENCOUNTER — Emergency Department (HOSPITAL_BASED_OUTPATIENT_CLINIC_OR_DEPARTMENT_OTHER): Payer: 59

## 2022-12-01 ENCOUNTER — Emergency Department (HOSPITAL_BASED_OUTPATIENT_CLINIC_OR_DEPARTMENT_OTHER)
Admission: EM | Admit: 2022-12-01 | Discharge: 2022-12-01 | Disposition: A | Discharge status: Home / Self Care | Payer: 59 | Attending: Emergency Medicine | Admitting: Emergency Medicine

## 2022-12-01 DIAGNOSIS — Z7982 Long term (current) use of aspirin: Secondary | ICD-10-CM | POA: Diagnosis not present

## 2022-12-01 DIAGNOSIS — Z79899 Other long term (current) drug therapy: Secondary | ICD-10-CM | POA: Diagnosis not present

## 2022-12-01 DIAGNOSIS — M5136 Other intervertebral disc degeneration, lumbar region: Secondary | ICD-10-CM | POA: Insufficient documentation

## 2022-12-01 DIAGNOSIS — M79662 Pain in left lower leg: Secondary | ICD-10-CM | POA: Insufficient documentation

## 2022-12-01 DIAGNOSIS — I10 Essential (primary) hypertension: Secondary | ICD-10-CM | POA: Diagnosis not present

## 2022-12-01 DIAGNOSIS — I2510 Atherosclerotic heart disease of native coronary artery without angina pectoris: Secondary | ICD-10-CM | POA: Diagnosis not present

## 2022-12-01 MED ORDER — ACETAMINOPHEN 500 MG PO TABS
1000.0000 mg | ORAL_TABLET | Freq: Once | ORAL | Status: AC
  Administered 2022-12-01: 1000 mg via ORAL
  Filled 2022-12-01: qty 2

## 2022-12-01 MED ORDER — KETOROLAC TROMETHAMINE 60 MG/2ML IM SOLN
30.0000 mg | Freq: Once | INTRAMUSCULAR | Status: AC
  Administered 2022-12-01: 30 mg via INTRAMUSCULAR
  Filled 2022-12-01: qty 2

## 2022-12-01 MED ORDER — LIDOCAINE 5 % EX PTCH: 1.0000 | MEDICATED_PATCH | CUTANEOUS | 0 refills | Status: AC

## 2022-12-01 MED ORDER — LIDOCAINE 5 % EX PTCH
1.0000 | MEDICATED_PATCH | CUTANEOUS | Status: DC
  Administered 2022-12-01: 1 via TRANSDERMAL
  Filled 2022-12-01: qty 1

## 2022-12-01 NOTE — ED Triage Notes (Signed)
Pt reports L ankle pain x "a few weeks" and now reports numbness to ankle since "yesterday evening"  Pt seen at ortho on Friday and given prednisone with no relief.

## 2022-12-01 NOTE — ED Provider Notes (Signed)
[place] [facility] Provider Note   CSN: [id number] Arrival date & time: 12/01/22  1308     History  Chief Complaint  Patient presents with   Leg Pain    Robin Ali is a 82 y.o. female.  The history is provided by the patient.  Leg Pain Location:  Leg Time since incident: weeks. Injury: no   Leg location:  L lower leg Pain details:    Quality:  Aching   Radiates to:  Does not radiate   Severity:  Severe   Onset quality:  Gradual   Duration: weeks.   Timing:  Constant   Progression:  Worsening Dislocation: no   Prior injury to area:  No Relieved by:  Nothing Worsened by:  Bearing weight (and rest) Ineffective treatments: steroids and muscle relaxants. Associated symptoms: no fever   Risk factors: no concern for non-accidental trauma   Patient with gout was told it could be a nerve problem but would like a second opnion as symptoms continue despite medication from her orthopedic surgeon,  Does not feel like her gout, no swelling.      Past Medical History:  Diagnosis Date   CAD (coronary artery disease)    GERD (gastroesophageal reflux disease)    Gout    Heart attack (HCC)    Hiatal hernia    Hypertension      Home Medications Prior to Admission medications   Medication Sig Start Date End Date Taking? Authorizing Provider  lidocaine (LIDODERM) 5 % Place 1 patch onto the skin daily. Remove & Discard patch within 12 hours or as directed by MD 12/01/22  Yes Nechama Escutia, MD  aspirin EC 81 MG tablet Take 81 mg by mouth daily.    [provider]  atorvastatin (LIPITOR) 40 MG tablet Take 40 mg by mouth daily.    [provider]  carvedilol (COREG) 25 MG tablet Take 25 mg by mouth 2 (two) times daily with a meal.    [provider]  colchicine 0.6 MG tablet Take 0.6 mg by mouth daily.    [provider]  NIFEdipine (PROCARDIA XL/ADALAT-CC) 60 MG 24 hr tablet Take 60 mg by mouth daily.     [provider]  nitroGLYCERIN (NITROSTAT) 0.4 MG SL tablet Place 0.4 mg under the tongue every 5 (five) minutes as needed for chest pain.    [provider]  oxyCODONE (OXY IR/ROXICODONE) 5 MG immediate release tablet Take 1-2 tablets (5-10 mg total) by mouth every 6 (six) hours as needed for moderate pain or severe pain. 11/13/16   Meuth, Brooke A, PA-C  pantoprazole (PROTONIX) 40 MG tablet Take 40 mg by mouth daily.    [provider]      Allergies    Ace inhibitors and Norvasc [amlodipine besylate]    Review of Systems   Review of Systems  Constitutional:  Negative for fever.  HENT:  Negative for facial swelling.   Eyes:  Negative for redness.  Gastrointestinal:  Negative for vomiting.  Musculoskeletal:  Positive for arthralgias.    Physical Exam Updated Vital Signs BP (!) 187/64   Pulse (!) 59   Temp 98.1 F (36.7 C) (Oral)   Resp 18   Ht 5\' 1"  (1.549 m)   Wt 73.9 kg   SpO2 95%   BMI 30.80 kg/m  Physical Exam Vitals and nursing note reviewed.  Constitutional:      General: She is not in acute distress.  Appearance: She is well-developed.  HENT:     Head: Normocephalic and atraumatic.  Eyes:     Pupils: Pupils are equal, round, and reactive to light.  Cardiovascular:     Rate and Rhythm: Normal rate and regular rhythm.     Pulses: Normal pulses.     Heart sounds: Normal heart sounds.  Pulmonary:     Effort: Pulmonary effort is normal. No respiratory distress.     Breath sounds: Normal breath sounds.  Abdominal:     General: Bowel sounds are normal. There is no distension.     Palpations: Abdomen is soft.     Tenderness: There is no abdominal tenderness. There is no guarding or rebound.  Musculoskeletal:        General: Normal range of motion.     Cervical back: Neck supple.     Left knee: No swelling, deformity, effusion, erythema or ecchymosis.     Instability Tests: Anterior drawer test negative. Posterior drawer test negative.      Left lower leg: Normal.     Right ankle: Normal.     Right Achilles Tendon: Normal.     Left ankle: Normal.     Left Achilles Tendon: Normal.     Right foot: Normal.     Left foot: Normal.  Skin:    General: Skin is warm and dry.     Capillary Refill: Capillary refill takes less than 2 seconds.     Findings: No bruising, erythema or rash.  Neurological:     General: No focal deficit present.     Deep Tendon Reflexes: Reflexes normal.  Psychiatric:        Mood and Affect: Mood normal.     ED Results / Procedures / Treatments   Labs (all labs ordered are listed, but only abnormal results are displayed) Labs Reviewed - No data to display  EKG None  Radiology DG Lumbar Spine Complete  Result Date: 12/01/2022 CLINICAL DATA:  82 year old female with ankle pain for a few weeks. Left ankle numbness onset yesterday. EXAM: LUMBAR SPINE - COMPLETE 4+ VIEW COMPARISON:  CT Abdomen and Pelvis 11/11/2016. FINDINGS: Normal lumbar segmentation as seen on the comparison. Bone mineralization is within normal limits for age. Largely stable lumbar lordosis since that time, although grade 1 degenerative anterolisthesis at both L4-L5 and L5-S1 is more apparent. Maintained vertebral body height. No pars fracture. But moderate to severe bilateral lower lumbar facet degeneration. Vacuum facet on the previous CT. Less pronounced, up to moderate L4-L5 and L5-S1 disc space loss with some vacuum disc. Bilateral total hip arthroplasty. Grossly intact visible sacrum and SI joints. No acute osseous abnormality identified. Aortoiliac calcified atherosclerosis. Negative visible bowel gas pattern. IMPRESSION: 1. No acute osseous abnormality identified in the Lumbar Spine. 2. But severe lower lumbar facet arthropathy in association with grade 1 degenerative anterolisthesis at both L4-L5 and L5-S1. Moderate disc degeneration at those levels. 3. Bilateral total hip arthroplasty. 4. Aortic Atherosclerosis (ICD10-I70.0).  Electronically Signed   By: Odessa Fleming M.D.   On: 12/01/2022 05:16   DG Tibia/Fibula Left  Result Date: 12/01/2022 CLINICAL DATA:  82 year old female with ankle pain for a few weeks. Left ankle numbness onset yesterday. EXAM: LEFT TIBIA AND FIBULA - 2 VIEW COMPARISON:  Left knee series 08/14/2008. FINDINGS: Bone mineralization is within normal limits for age. Maintained alignment at the left knee. No evidence of knee joint effusion. Progressed medial and patellofemoral compartment degeneration since the 2010 radiographs. Left tibia and fibula appear  intact. Preserved left ankle joint alignment. No evidence of joint effusion. Calcaneus appears intact with degenerative spurring. No acute osseous abnormality identified. Calcified peripheral vascular disease most pronounced in the region of the popliteal fossa. IMPRESSION: 1. No acute osseous abnormality identified about the left tib-fib. 2. Calcified peripheral vascular disease. Progressed left knee osteoarthritis since 2010. Electronically Signed   By: Odessa Fleming M.D.   On: 12/01/2022 05:02    Procedures Procedures    Medications Ordered in ED Medications  lidocaine (LIDODERM) 5 % 1 patch (1 patch Transdermal Patch Applied 12/01/22 0306)  ketorolac (TORADOL) injection 30 mg (30 mg Intramuscular Given 12/01/22 0306)  acetaminophen (TYLENOL) tablet 1,000 mg (1,000 mg Oral Given 12/01/22 1324)    ED Course/ Medical Decision Making/ A&P                                 Medical Decision Making Patient with LLE pain   Amount and/or Complexity of Data Reviewed Independent Historian:     Details: Daughter see above  External Data Reviewed: notes.    Details: Previous notes reviewed  Radiology: ordered and independent interpretation performed.    Details: DDD of the L spine, No fractures of the tibia or fibula   Risk OTC drugs. Prescription drug management. Risk Details: Patient with normal exam 3+ dorsalis pedis.  FROM of the LLE.  Will rule out DVT.   Have advised close follow up with PMD and orthopedic surgeon for ongoing care.  Stable for discharge.      Final Clinical Impression(s) / ED Diagnoses Final diagnoses:  DDD (degenerative disc disease), lumbar  Pain in left shin   Return for intractable cough, coughing up blood, fevers > 100.4 unrelieved by medication, shortness of breath, intractable vomiting, chest pain, shortness of breath, weakness, numbness, changes in speech, facial asymmetry, abdominal pain, passing out, Inability to tolerate liquids or food, cough, altered mental status or any concerns. No signs of systemic illness or infection. The patient is nontoxic-appearing on exam and vital signs are within normal limits.  I have reviewed the triage vital signs and the nursing notes. Pertinent labs & imaging results that were available during my care of the patient were reviewed by me and considered in my medical decision making (see chart for details). After history, exam, and medical workup I feel the patient has been appropriately medically screened and is safe for discharge home. Pertinent diagnoses were discussed with the patient. Patient was given return precautions.  Rx / DC Orders ED Discharge Orders          Ordered    Lower Ext Left Venous US       Comments: Specimen [id number]: IMPORTANT PATIENT INSTRUCTIONS:  You have been scheduled for an Outpatient Ultrasound.  Your appointment has been scheduled for:  _______ am/pm on _______________ (date).If your appointment is scheduled for a Saturday, Sunday or holiday, please go to the [facility] Emergency Department Registration Desk at least 15 minutes prior to your appointment time and tell them you are there for an ultrasound.  If your appointment is scheduled for a weekday (Monday - Friday), please go directly to the [facility] Radiology Department reception area at least 15 minutes prior to your appointment time and tell them you are there for an  ultrasound.Please call [phone] with questions.    12/01/22 0524    lidocaine (LIDODERM) 5 %  Every 24 hours  12/01/22 0534              Mykal Batiz, MD 12/01/22 1610

## 2024-01-30 ENCOUNTER — Emergency Department (HOSPITAL_BASED_OUTPATIENT_CLINIC_OR_DEPARTMENT_OTHER)

## 2024-01-30 ENCOUNTER — Emergency Department (HOSPITAL_BASED_OUTPATIENT_CLINIC_OR_DEPARTMENT_OTHER)
Admission: EM | Admit: 2024-01-30 | Discharge: 2024-01-30 | Disposition: A | Discharge status: Home Health | Attending: Emergency Medicine | Admitting: Emergency Medicine

## 2024-01-30 ENCOUNTER — Encounter (HOSPITAL_BASED_OUTPATIENT_CLINIC_OR_DEPARTMENT_OTHER): Payer: Self-pay

## 2024-01-30 ENCOUNTER — Other Ambulatory Visit: Payer: Self-pay

## 2024-01-30 DIAGNOSIS — I2510 Atherosclerotic heart disease of native coronary artery without angina pectoris: Secondary | ICD-10-CM | POA: Diagnosis not present

## 2024-01-30 DIAGNOSIS — Z7982 Long term (current) use of aspirin: Secondary | ICD-10-CM | POA: Diagnosis not present

## 2024-01-30 DIAGNOSIS — Z79899 Other long term (current) drug therapy: Secondary | ICD-10-CM | POA: Insufficient documentation

## 2024-01-30 DIAGNOSIS — I10 Essential (primary) hypertension: Secondary | ICD-10-CM | POA: Insufficient documentation

## 2024-01-30 DIAGNOSIS — J069 Acute upper respiratory infection, unspecified: Secondary | ICD-10-CM | POA: Diagnosis not present

## 2024-01-30 DIAGNOSIS — N3000 Acute cystitis without hematuria: Secondary | ICD-10-CM | POA: Diagnosis not present

## 2024-01-30 DIAGNOSIS — R059 Cough, unspecified: Secondary | ICD-10-CM | POA: Diagnosis present

## 2024-01-30 LAB — COMPREHENSIVE METABOLIC PANEL WITH GFR
ALT: 13 U/L (ref 0–44)
AST: 27 U/L (ref 15–41)
Albumin: 4.2 g/dL (ref 3.5–5.0)
Alkaline Phosphatase: 68 U/L (ref 38–126)
Anion gap: 14 (ref 5–15)
BUN: 22 mg/dL (ref 8–23)
CO2: 23 mmol/L (ref 22–32)
Calcium: 9.9 mg/dL (ref 8.9–10.3)
Chloride: 100 mmol/L (ref 98–111)
Creatinine, Ser: 1.17 mg/dL — ABNORMAL HIGH (ref 0.44–1.00)
GFR, Estimated: 46 mL/min — ABNORMAL LOW (ref >60)
Glucose, Bld: 122 mg/dL — ABNORMAL HIGH (ref 70–99)
Potassium: 3.6 mmol/L (ref 3.5–5.1)
Sodium: 137 mmol/L (ref 135–145)
Total Bilirubin: 0.4 mg/dL (ref 0.0–1.2)
Total Protein: 7.6 g/dL (ref 6.5–8.1)

## 2024-01-30 LAB — CBC WITH DIFFERENTIAL/PLATELET
Abs Immature Granulocytes: 0.05 K/uL (ref 0.00–0.07)
Basophils Absolute: 0.1 K/uL (ref 0.0–0.1)
Basophils Relative: 1 %
Eosinophils Absolute: 0.4 K/uL (ref 0.0–0.5)
Eosinophils Relative: 3 %
HCT: 35.4 % — ABNORMAL LOW (ref 36.0–46.0)
Hemoglobin: 12.4 g/dL (ref 12.0–15.0)
Immature Granulocytes: 0 %
Lymphocytes Relative: 28 %
Lymphs Abs: 3.4 K/uL (ref 0.7–4.0)
MCH: 26.8 pg (ref 26.0–34.0)
MCHC: 35 g/dL (ref 30.0–36.0)
MCV: 76.6 fL — ABNORMAL LOW (ref 80.0–100.0)
Monocytes Absolute: 1.3 K/uL — ABNORMAL HIGH (ref 0.1–1.0)
Monocytes Relative: 11 %
Neutro Abs: 6.8 K/uL (ref 1.7–7.7)
Neutrophils Relative %: 57 %
Platelets: 274 K/uL (ref 150–400)
RBC: 4.62 MIL/uL (ref 3.87–5.11)
RDW: 15.1 % (ref 11.5–15.5)
WBC: 11.9 K/uL — ABNORMAL HIGH (ref 4.0–10.5)
nRBC: 0 % (ref 0.0–0.2)

## 2024-01-30 LAB — URINALYSIS, W/ REFLEX TO CULTURE (INFECTION SUSPECTED)
Bilirubin Urine: NEGATIVE
Glucose, UA: NEGATIVE mg/dL
Hgb urine dipstick: NEGATIVE
Ketones, ur: NEGATIVE mg/dL
Nitrite: NEGATIVE
Protein, ur: NEGATIVE mg/dL
Specific Gravity, Urine: 1.01 (ref 1.005–1.030)
pH: 6.5 (ref 5.0–8.0)

## 2024-01-30 LAB — T4, FREE: Free T4: 0.85 ng/dL (ref 0.61–1.12)

## 2024-01-30 LAB — TROPONIN T, HIGH SENSITIVITY
Troponin T High Sensitivity: <15 ng/L (ref 0–19)
Troponin T High Sensitivity: <15 ng/L (ref 0–19)

## 2024-01-30 LAB — RESP PANEL BY RT-PCR (RSV, FLU A&B, COVID)  RVPGX2
Influenza A by PCR: NEGATIVE
Influenza B by PCR: NEGATIVE
Resp Syncytial Virus by PCR: NEGATIVE
SARS Coronavirus 2 by RT PCR: NEGATIVE

## 2024-01-30 LAB — TSH: TSH: 3.388 u[IU]/mL (ref 0.350–4.500)

## 2024-01-30 LAB — MAGNESIUM: Magnesium: 2.2 mg/dL (ref 1.7–2.4)

## 2024-01-30 MED ORDER — CEPHALEXIN 500 MG PO CAPS: 500.0000 mg | ORAL_CAPSULE | Freq: Four times a day (QID) | ORAL | 0 refills | Status: AC

## 2024-01-30 MED ORDER — BENZONATATE 100 MG PO CAPS: 100.0000 mg | ORAL_CAPSULE | Freq: Three times a day (TID) | ORAL | 0 refills | Status: AC

## 2024-01-30 MED ORDER — LACTATED RINGERS IV BOLUS
1000.0000 mL | Freq: Once | INTRAVENOUS | Status: AC
  Administered 2024-01-30: 1000 mL via INTRAVENOUS

## 2024-01-30 NOTE — ED Provider Notes (Signed)
 [place] [facility] Provider Note   CSN: [id number] Arrival date & time: 01/30/24  9070     Patient presents with: Fatigue and Cough   Robin Ali is a 83 y.o. female who presents to the ED today with primary concern of a nonproductive cough that has been present over the last 3 days along with a generalized weakness and fatigue that has been present for several months.  Largest change was the beginning of the cough along with a headache and worsening of her fatigue.  She still endorses a normal appetite with last oral intake being yesterday evening, states that she was being given over-the-counter magnesium as she recently had a low magnesium level at labs taken at her primary care, noted at 1.5 on her records.  She has previous medical diagnoses of hypertension, gout, CAD with previous stent placement, GERD, and hyperlipidemia.  Review of her medical record shows that she had an echocardiogram stress test performed in 05 January 2024.  This was secondary to exertional dyspnea.  According to the procedure note the exam was terminated prior to completion due to patient's fatigue and dyspnea.  They had achieved 4.6 METS, resting echocardiogram showed normal left ventricular function, stress echo did not show any wall motion abnormalities nor did it show any inducible ischemia.  Review of previous ultrasound of her thyroid was done and 24 November 2023 showed large heterogenous thyroid, there is a left nodule that was previously biopsied does not significantly change since previous imaging.  She does have a right thyroid nodule that is also does not show any significant growth indicating likely benign etiology.  Most recent testing of the thyroid shows TSH level was 1.47 in August 2025 along with a free T4 of 0.9.  In April 2025 TSH was 4.54.    Cough      Prior to Admission medications   Medication Sig Start Date End Date Taking? Authorizing  Provider  benzonatate (TESSALON) 100 MG capsule Take 1 capsule (100 mg total) by mouth every 8 (eight) hours. 01/30/24  Yes Myriam Dorn BROCKS, PA  cephALEXin (KEFLEX) 500 MG capsule Take 1 capsule (500 mg total) by mouth 4 (four) times daily for 7 days. 01/30/24 02/06/24 Yes Myriam Dorn BROCKS, PA  aspirin  EC 81 MG tablet Take 81 mg by mouth daily.    [provider]  atorvastatin (LIPITOR) 40 MG tablet Take 40 mg by mouth daily.    [provider]  carvedilol  (COREG ) 25 MG tablet Take 25 mg by mouth 2 (two) times daily with a meal.    [provider]  colchicine  0.6 MG tablet Take 0.6 mg by mouth daily.    [provider]  lidocaine  (LIDODERM ) 5 % Place 1 patch onto the skin daily. Remove & Discard patch within 12 hours or as directed by MD 12/01/22   Palumbo, April, MD  NIFEdipine  (PROCARDIA  XL/ADALAT -CC) 60 MG 24 hr tablet Take 60 mg by mouth daily.    [provider]  nitroGLYCERIN  (NITROSTAT ) 0.4 MG SL tablet Place 0.4 mg under the tongue every 5 (five) minutes as needed for chest pain.    [provider]  oxyCODONE  (OXY IR/ROXICODONE ) 5 MG immediate release tablet Take 1-2 tablets (5-10 mg total) by mouth every 6 (six) hours as needed for moderate pain or severe pain. 11/13/16   Meuth, Brooke A, PA-C  pantoprazole  (PROTONIX ) 40 MG tablet Take 40 mg by mouth daily.    [provider]  Allergies: Ace inhibitors and Norvasc [amlodipine besylate]    Review of Systems  Constitutional:  Positive for fatigue. Negative for appetite change.  Respiratory:  Positive for cough.   Neurological:  Positive for weakness.  All other systems reviewed and are negative.   Updated Vital Signs BP (!) 146/56   Pulse 63   Temp 98 F (36.7 C) (Oral)   Resp 18   SpO2 99%   Physical Exam Vitals and nursing note reviewed.  Constitutional:      General: She is not in acute distress.    Appearance: Normal appearance.  HENT:     Head:  Normocephalic and atraumatic.     Mouth/Throat:     Mouth: Mucous membranes are moist.     Pharynx: Oropharynx is clear.  Eyes:     Extraocular Movements: Extraocular movements intact.     Conjunctiva/sclera: Conjunctivae normal.     Pupils: Pupils are equal, round, and reactive to light.  Cardiovascular:     Rate and Rhythm: Normal rate and regular rhythm.     Pulses: Normal pulses.     Heart sounds: Normal heart sounds. No murmur heard.    No friction rub. No gallop.  Pulmonary:     Effort: Pulmonary effort is normal.     Breath sounds: Normal breath sounds.  Abdominal:     General: Abdomen is flat. Bowel sounds are normal.     Palpations: Abdomen is soft.  Musculoskeletal:        General: Normal range of motion.     Cervical back: Normal range of motion and neck supple.     Right lower leg: No edema.     Left lower leg: No edema.  Skin:    General: Skin is warm and dry.     Capillary Refill: Capillary refill takes less than 2 seconds.  Neurological:     General: No focal deficit present.     Mental Status: She is alert. Mental status is at baseline.  Psychiatric:        Mood and Affect: Mood normal.     (all labs ordered are listed, but only abnormal results are displayed) Labs Reviewed  COMPREHENSIVE METABOLIC PANEL WITH GFR - Abnormal; Notable for the following components:      Result Value   Glucose, Bld 122 (*)    Creatinine, Ser 1.17 (*)    GFR, Estimated 46 (*)    All other components within normal limits  CBC WITH DIFFERENTIAL/PLATELET - Abnormal; Notable for the following components:   WBC 11.9 (*)    HCT 35.4 (*)    MCV 76.6 (*)    Monocytes Absolute 1.3 (*)    All other components within normal limits  URINALYSIS, W/ REFLEX TO CULTURE (INFECTION SUSPECTED) - Abnormal; Notable for the following components:   APPearance CLOUDY (*)    Leukocytes,Ua SMALL (*)    Bacteria, UA MANY (*)    All other components within normal limits  RESP PANEL BY RT-PCR  (RSV, FLU A&B, COVID)  [id number]  MAGNESIUM  TSH  T4, FREE  TROPONIN T, HIGH SENSITIVITY  TROPONIN T, HIGH SENSITIVITY    EKG: EKG Interpretation Date/Time:  Sunday January 30 2024 09:39:19 EDT Ventricular Rate:  77 PR Interval:  181 QRS Duration:  92 QT Interval:  379 QTC Calculation: 429 R Axis:   -27  Text Interpretation: Sinus rhythm Borderline left axis deviation Abnormal R-wave progression, late transition Borderline T wave abnormalities Confirmed by Darra Chew [phone]) on 01/30/2024  9:40:20 AM  Radiology: DG Chest 2 View Result Date: 01/30/2024 CLINICAL DATA:  Cough 3 days with weakness.  Headache. EXAM: CHEST - 2 VIEW COMPARISON:  05/20/2022 FINDINGS: Lungs are adequately inflated without focal airspace consolidation or effusion. Cardiomediastinal silhouette is normal. Stable known enlarged thyroid. Remainder the exam is unchanged. IMPRESSION: No acute cardiopulmonary disease. Electronically Signed   By: Toribio Agreste M.D.   On: 01/30/2024 10:36     Procedures   Medications Ordered in the ED  lactated ringers  bolus 1,000 mL (0 mLs Intravenous Stopped 01/30/24 1410)                                    Medical Decision Making Amount and/or Complexity of Data Reviewed Labs: ordered. Radiology: ordered.  Risk Prescription drug management.   Medical Decision Making:   Robin Ali is a 83 y.o. female who presented to the ED today with fatigue along with new onset of cough and nasal congestion detailed above.    Additional history discussed with patient's family/caregivers.  External chart has been reviewed including previous labs, imaging, outpatient primary care records. Patient placed on continuous vitals and telemetry monitoring while in ED which was reviewed periodically.  Complete initial physical exam performed, notably the patient  was alert and oriented in no apparent distress.  Pulmonary auscultation is unremarkable she does have notable congestion as well  as postnasal drip..    Reviewed and confirmed nursing documentation for past medical history, family history, social history.    Initial Assessment:   With the patient's presentation of chronic fatigue along with new onset of cough, consider new onset of viral URI, pneumonia, viral syndrome.  Regarding the fatigue, consider electrolyte abnormality, anemia, hypothyroidism, medication side effect.  Initial Plan:  Obtain nasopharyngeal swab to assess for possible viral etiology. Screening labs including CBC and Metabolic panel to evaluate for infectious or metabolic etiology of disease.  Secondary to previous noted low magnesium, draw magnesium level. Urinalysis with reflex culture ordered to evaluate for UTI or relevant urologic/nephrologic pathology.  CXR to evaluate for structural/infectious intrathoracic pathology.  EKG and serial troponin to evaluate for cardiac pathology. Secondary to fatigue, assess TSH and free T4. Objective evaluation as below reviewed   Initial Study Results:   Laboratory  All laboratory results reviewed without evidence of clinically relevant pathology.   Exceptions include: UA does show small leukocytes as well as large presence of bacteria.  Creatinine is elevated to 1.17 and GFR corresponding to 46.  Mild leukocytosis up to 11.9, note hematocrit is decreased to 35.4.  EKG EKG was reviewed independently. Rate, rhythm, axis, intervals all examined and without medically relevant abnormality. ST segments without concerns for elevations.    Radiology:  All images reviewed independently. Agree with radiology report at this time.   DG Chest 2 View Result Date: 01/30/2024 CLINICAL DATA:  Cough 3 days with weakness.  Headache. EXAM: CHEST - 2 VIEW COMPARISON:  05/20/2022 FINDINGS: Lungs are adequately inflated without focal airspace consolidation or effusion. Cardiomediastinal silhouette is normal. Stable known enlarged thyroid. Remainder the exam is unchanged.  IMPRESSION: No acute cardiopulmonary disease. Electronically Signed   By: Toribio Agreste M.D.   On: 01/30/2024 10:36     Reassessment and Plan:   Given findings of elevated creatinine and decreased renal function, along with poor skin turgor and dry oral mucosa, provided patient with liter of LR to manage fluid volume deficit.  UA does show presence of leukocytes as well as large presence of bacteria.  Given this finding we will begin empiric therapy with cephalexin for management of UTI.  Remainder of workup is largely unremarkable, electrolytes are within normal limits as is magnesium.  TSH and T4 still pending however patient was informed to follow-up with primary care to follow-up on these results.  Nasopharyngeal swab is negative for COVID/flu/RSV.  Given further findings feel that this patient does have a viral URI of uncertain specificity, begin symptomatic care, we will provide her with a course of Tessalon to manage her cough and discussed use of nasal saline to facilitate clearance of mucus from the nasopharynx.  Further advised to follow-up with primary care regarding these findings.  Both patient and her daughter understand and agree with this care plan, have no further concerns at this time.  As vital signs are stable within normal limits, workup and physical exam are largely reassuring, will discharge patient with outpatient follow-up.       Final diagnoses:  Acute cystitis without hematuria  Viral URI with cough    ED Discharge Orders          Ordered    benzonatate (TESSALON) 100 MG capsule  Every 8 hours        01/30/24 1422    cephALEXin (KEFLEX) 500 MG capsule  4 times daily        01/30/24 1422               Myriam Dorn BROCKS, [place] 01/30/24 1447    Darra Fonda MATSU, MD 02/09/24 [phone]

## 2024-01-30 NOTE — ED Triage Notes (Signed)
 Pt reports non-productive cough X 3 days. Also reports generalized weakness & fatigue. Also endorses headache. Reports mag was low and was replaced by PCP.

## 2024-01-30 NOTE — ED Notes (Addendum)
 Pt alert and oriented X 4 at the time of discharge. RR even and unlabored. No acute distress noted. Pt verbalized understanding of discharge instructions as discussed. Pt in wheelchair to lobby at time of discharge.

## 2024-01-30 NOTE — Discharge Instructions (Signed)
 As we discussed, the thyroid panel that was drawn today will take some time to result, would highly recommend you follow-up with your MyChart as well as your primary care provider for discussion of results regarding this test.  The remainder of the workup shows that she does have a budding UTI which we are beginning to manage with the Keflex.  She also has a likely viral upper respiratory infection which the Tessalon Perles should help with her cough, otherwise would use the saline rinse as we previously discussed to facilitate clearance of mucus from the nasal cavity and the back of the throat.  Otherwise increased fluid intake, and follow-up with primary care within the next 2 weeks for reassessment.
